# Patient Record
Sex: Female | Born: 1980 | Race: Black or African American | Hispanic: No | Marital: Single | Smoking: Current every day smoker
Health system: Southern US, Community
[De-identification: ages and names within clinical notes are randomized; demographics above are authoritative.]

## PROBLEM LIST (undated history)

## (undated) DIAGNOSIS — I10 Essential (primary) hypertension: Secondary | ICD-10-CM

## (undated) DIAGNOSIS — E669 Obesity, unspecified: Secondary | ICD-10-CM

## (undated) DIAGNOSIS — B20 Human immunodeficiency virus [HIV] disease: Secondary | ICD-10-CM

## (undated) DIAGNOSIS — Z789 Other specified health status: Secondary | ICD-10-CM

## (undated) DIAGNOSIS — E66811 Obesity, class 1: Secondary | ICD-10-CM

## (undated) HISTORY — DX: Obesity, unspecified: E66.9

## (undated) HISTORY — DX: Obesity, class 1: E66.811

## (undated) HISTORY — DX: Other specified health status: Z78.9

## (undated) HISTORY — DX: Essential (primary) hypertension: I10

## (undated) NOTE — ED Provider Notes (Signed)
 Formatting of this note is different from the original. Images from the original note were not included. [facility] [address] [place] [address] [place] [address] [phone]  History Chief Complaint  Patient presents with   Fall    Patient had a slip and fall last night on ice, patient report left knee pain. Patient report right hand pain unrelated to the fall    Patient is a 19 year old female with past medical history of anxiety and depression as well as hypertension who presented to the ED with complaint of acute onset persistent left knee pain after she slipped on ice and fell down landing on her left knee two days ago.  Patient states that the pain has been progressively worsening on especially with movement of the last 24 hours.  Patient states that she is unable to bear weight in the left leg because of the left knee pain.  Patient denies dizziness, syncope, seizures, loss of consciousness, head or neck injuries, back pain, hip pain, numbness and tingling or weakness of lower extremities bilaterally.  Past Medical History:  Diagnosis Date   Anxiety    Depression    Hypertension    HISTORY PROVIDED BY:  Patient  Past Surgical History:  Procedure Laterality Date   CESAREAN SECTION       Review of Systems  Constitutional: Negative.  Negative for appetite change, chills, fatigue and fever.  HENT: Negative.  Negative for congestion, drooling, ear discharge, ear pain, facial swelling, mouth sores, postnasal drip, rhinorrhea, tinnitus, trouble swallowing and voice change.   Eyes: Negative.  Negative for photophobia, pain, discharge, itching and visual disturbance.  Respiratory: Negative.  Negative for cough, chest tightness, shortness of breath and wheezing.   Cardiovascular: Negative.  Negative for chest pain, palpitations and leg swelling.  Gastrointestinal: Negative.  Negative for abdominal pain, blood in stool, constipation, diarrhea, nausea and vomiting.  Genitourinary:  Negative.  Negative for difficulty urinating, dysuria, enuresis, flank pain, hematuria, menstrual problem, pelvic pain and urgency.  Musculoskeletal:  Positive for arthralgias and joint swelling. Negative for back pain, gait problem, myalgias, neck pain and neck stiffness.       Left knee pain with a mild swelling  Skin: Negative.  Negative for color change, pallor, rash and wound.  Neurological: Negative.  Negative for dizziness, tremors, seizures, syncope, facial asymmetry, speech difficulty, weakness, light-headedness and numbness.  All other systems reviewed and are negative.  Physical Exam BP (!) 173/93 (BP Location: Right arm, Patient Position: Sitting)   Pulse 75   Temp 97.8 F (36.6 C) (Oral)   Resp 18   Ht 5' 4 (1.626 m)   Wt 104 kg (230 lb)   SpO2 99%   BMI 39.48 kg/m   Physical Exam Vitals and nursing note reviewed.  Constitutional:      General: She is awake.     Appearance: Normal appearance. She is well-developed, well-groomed and normal weight.  HENT:     Head: Normocephalic and atraumatic.     Jaw: There is normal jaw occlusion.     Right Ear: Hearing, tympanic membrane, ear canal and external ear normal.     Left Ear: Hearing, tympanic membrane, ear canal and external ear normal.     Nose: Nose normal.     Mouth/Throat:     Lips: Pink.     Mouth: Mucous membranes are moist.     Pharynx: Oropharynx is clear. Uvula midline.  Eyes:     General: Lids are normal. Vision grossly intact. Gaze  aligned appropriately.     Extraocular Movements: Extraocular movements intact.     Conjunctiva/sclera: Conjunctivae normal.     Pupils: Pupils are equal, round, and reactive to light.  Neck:     Trachea: Trachea and phonation normal.  Cardiovascular:     Rate and Rhythm: Normal rate and regular rhythm.     Pulses: Normal pulses.     Heart sounds: Normal heart sounds.  Pulmonary:     Effort: Pulmonary effort is normal.     Breath sounds: Normal breath sounds and air  entry. No decreased breath sounds, wheezing, rhonchi or rales.  Abdominal:     General: Abdomen is flat. Bowel sounds are normal.     Palpations: Abdomen is soft.     Tenderness: There is no abdominal tenderness.  Musculoskeletal:        General: Swelling, tenderness and signs of injury present.     Cervical back: Normal, full passive range of motion without pain, normal range of motion and neck supple.     Thoracic back: Normal.     Lumbar back: Normal.     Right knee: Normal.     Left knee: Swelling present. No deformity. Decreased range of motion. Tenderness present over the medial joint line and lateral joint line.       Legs:     Comments: Palpable left knee tenderness with mild swelling  Skin:    General: Skin is warm and dry.     Capillary Refill: Capillary refill takes less than 2 seconds.    Neurological:     General: No focal deficit present.     Mental Status: She is alert and oriented to person, place, and time.     Cranial Nerves: Cranial nerves 2-12 are intact.     Sensory: Sensation is intact.     Motor: Motor function is intact.     Coordination: Coordination is intact.     Gait: Gait is intact.     Deep Tendon Reflexes: Reflexes are normal and symmetric.  Psychiatric:        Behavior: Behavior is cooperative.   Results Labs Reviewed - No data to display  Imaging Imaging Results       X-ray Knee 3 View Left (Final result)  Result time 12/13/23 14:50:32    Final result by Adine GORMAN Fass, MD (12/13/23 14:50:32)        Impression:   No acute osseous abnormality of the left knee.  Electronically signed by:  Adine Fass MD  12/13/2023 02:50 PM EST RP Workstation: [id number]       Narrative:   EXAMINATION: XR KNEE 3 VIEW LEFT  CLINICAL INDICATION: Female, 63 years old. injury;   COMPARISON: None.  FINDINGS:  Osseous Structures: There is normal anatomic alignment. There are no acute fractures. Inferior calcaneal enthesophyte is  present.  Joint Spaces: Joint spaces are preserved.  Bone Mineralization: Normal bone mineralization.  Soft Tissues: No soft tissue abnormality.              ED Course   Procedures  Medical Decision Making  This is a 45 year old female with past medical history of anxiety and depression as well as hypertension who presented to the ED with complaint of acute onset persistent left knee pain after she slipped on ice and fell down landing on her left knee two days ago.  Patient states that the pain has been progressively worsening on especially with movement of the last 24 hours.  Patient states that  she is unable to bear weight in the left leg because of the left knee pain.  In the ED, patient is alert and oriented x3 and is not in any distress.  Patient is hypotensive but afebrile in triaged.  Left knee x-ray showed no acute fractures or subluxations.  Patient was treated for pain in the ED with ibuprofen  800 mg p.o. x1, also received Zofran  4 mg ODT sublingual tablet x1, and Norco 5 mg-325 mg p.o. x1.  Differential diagnoses include but not limited to  -left knee sprain -left knee contusion -left knee fracture -left knee muscle strain  Patient's left knee was splinted with a Ace wrap and patient fitted with crutches for ambulation.  Patient's pain is well controlled with medication.  Patient was discharged home on medications for pain, ibuprofen  800 mg every 8 hours as needed, also given a prescription of muscle relaxants Robaxin 750 mg every 8 hours as needed.  Patient is advised to follow up with the her primary care physician in 7-10 days for re-evaluation or return to the ED immediately if symptoms get worse.  Problems Addressed: Contusion of left knee, initial encounter: complicated acute illness or injury Sprain of left knee, unspecified ligament, initial encounter: complicated acute illness or injury  Amount and/or Complexity of Data Reviewed Radiology:  Decision-making  details documented in ED Course.    Details: Left knee x-ray showed no acute fractures or subluxations.  Risk Prescription drug management.  ED Disposition Discharge  Final diagnoses:  Sprain of left knee, unspecified ligament, initial encounter  Contusion of left knee, initial encounter    Follow-up Information     [place] Traci Jenkins, MD In 1 week.   Specialty: [facility] Medicine Contact information [address] [place] [address] [phone]           Alm MALVA Burns, [place] 12/13/23 2055   Alm MALVA Burns, [place] 12/13/23 2056  Electronically signed by Shonna DELENA Bien Sr., MD at 01/01/2024 11:22 AM PST

---

## 1998-10-18 ENCOUNTER — Emergency Department (HOSPITAL_COMMUNITY): Admission: EM | Admit: 1998-10-18 | Discharge: 1998-10-18 | Payer: Self-pay | Admitting: *Deleted

## 1998-10-19 ENCOUNTER — Emergency Department (HOSPITAL_COMMUNITY): Admission: EM | Admit: 1998-10-19 | Discharge: 1998-10-19 | Payer: Self-pay | Admitting: Emergency Medicine

## 2011-06-17 ENCOUNTER — Inpatient Hospital Stay (INDEPENDENT_AMBULATORY_CARE_PROVIDER_SITE_OTHER)
Admission: RE | Admit: 2011-06-17 | Discharge: 2011-06-17 | Disposition: A | Discharge status: Home / Self Care | Payer: Medicaid Other | Source: Ambulatory Visit | Attending: Family Medicine | Admitting: Family Medicine

## 2011-06-17 DIAGNOSIS — J029 Acute pharyngitis, unspecified: Secondary | ICD-10-CM

## 2013-01-29 DIAGNOSIS — B20 Human immunodeficiency virus [HIV] disease: Secondary | ICD-10-CM

## 2013-01-29 HISTORY — DX: Human immunodeficiency virus (HIV) disease: B20

## 2013-02-22 ENCOUNTER — Telehealth: Payer: Self-pay

## 2013-02-22 NOTE — Telephone Encounter (Signed)
Referral from Dr Rayford Halsted @ [facility].  Pt is moving to [place], [place] and will need to be in care.  She is newly diagnosed and not on ART.  Genotype is pending and they will forward this lab once completed.   Patient states she has [place] Medicaid.  I have informed her of the need to see our financial counselor since this will not be valid in [place] until she has it switched.    Laurell Josephs, RN

## 2013-03-15 ENCOUNTER — Ambulatory Visit: Payer: Self-pay

## 2013-03-16 ENCOUNTER — Encounter (HOSPITAL_COMMUNITY): Payer: Self-pay | Admitting: Family Medicine

## 2013-03-16 ENCOUNTER — Emergency Department (HOSPITAL_COMMUNITY)
Admission: EM | Admit: 2013-03-16 | Discharge: 2013-03-16 | Disposition: A | Discharge status: Home / Self Care | Payer: Self-pay | Attending: Emergency Medicine | Admitting: Emergency Medicine

## 2013-03-16 DIAGNOSIS — B20 Human immunodeficiency virus [HIV] disease: Secondary | ICD-10-CM

## 2013-03-16 DIAGNOSIS — F17200 Nicotine dependence, unspecified, uncomplicated: Secondary | ICD-10-CM | POA: Insufficient documentation

## 2013-03-16 DIAGNOSIS — R197 Diarrhea, unspecified: Secondary | ICD-10-CM | POA: Insufficient documentation

## 2013-03-16 DIAGNOSIS — Z3202 Encounter for pregnancy test, result negative: Secondary | ICD-10-CM | POA: Insufficient documentation

## 2013-03-16 DIAGNOSIS — Z21 Asymptomatic human immunodeficiency virus [HIV] infection status: Secondary | ICD-10-CM | POA: Insufficient documentation

## 2013-03-16 HISTORY — DX: Human immunodeficiency virus (HIV) disease: B20

## 2013-03-16 LAB — URINALYSIS, ROUTINE W REFLEX MICROSCOPIC
Bilirubin Urine: NEGATIVE
Leukocytes, UA: NEGATIVE
Nitrite: NEGATIVE
Specific Gravity, Urine: 1.012 (ref 1.005–1.030)
Urobilinogen, UA: 0.2 mg/dL (ref 0.0–1.0)
pH: 5.5 (ref 5.0–8.0)

## 2013-03-16 LAB — COMPREHENSIVE METABOLIC PANEL
BUN: 10 mg/dL (ref 6–23)
CO2: 22 mEq/L (ref 19–32)
Calcium: 9.6 mg/dL (ref 8.4–10.5)
Creatinine, Ser: 0.75 mg/dL (ref 0.50–1.10)
GFR calc Af Amer: >90 mL/min (ref >90)
GFR calc non Af Amer: >90 mL/min (ref >90)
Glucose, Bld: 87 mg/dL (ref 70–99)
Sodium: 135 mEq/L (ref 135–145)
Total Protein: 8.4 g/dL — ABNORMAL HIGH (ref 6.0–8.3)

## 2013-03-16 LAB — CBC WITH DIFFERENTIAL/PLATELET
Eosinophils Absolute: 0 10*3/uL (ref 0.0–0.7)
Eosinophils Relative: 0 % (ref 0–5)
HCT: 34.8 % — ABNORMAL LOW (ref 36.0–46.0)
Lymphocytes Relative: 51 % — ABNORMAL HIGH (ref 12–46)
Lymphs Abs: 2.1 10*3/uL (ref 0.7–4.0)
MCH: 28.6 pg (ref 26.0–34.0)
MCV: 80.2 fL (ref 78.0–100.0)
Monocytes Absolute: 0.6 10*3/uL (ref 0.1–1.0)
Monocytes Relative: 15 % — ABNORMAL HIGH (ref 3–12)
Platelets: 165 10*3/uL (ref 150–400)
RBC: 4.34 MIL/uL (ref 3.87–5.11)

## 2013-03-16 MED ORDER — METRONIDAZOLE 500 MG PO TABS: 500.0000 mg | ORAL_TABLET | Freq: Two times a day (BID) | ORAL | Status: DC

## 2013-03-16 MED ORDER — ONDANSETRON HCL 4 MG/2ML IJ SOLN
4.0000 mg | Freq: Once | INTRAMUSCULAR | Status: AC
  Administered 2013-03-16: 4 mg via INTRAVENOUS
  Filled 2013-03-16: qty 2

## 2013-03-16 MED ORDER — SODIUM CHLORIDE 0.9 % IV SOLN
1000.0000 mL | Freq: Once | INTRAVENOUS | Status: AC
  Administered 2013-03-16: 1000 mL via INTRAVENOUS

## 2013-03-16 MED ORDER — SODIUM CHLORIDE 0.9 % IV SOLN
1000.0000 mL | INTRAVENOUS | Status: DC
  Administered 2013-03-16: 1000 mL via INTRAVENOUS

## 2013-03-16 MED ORDER — METOCLOPRAMIDE HCL 10 MG PO TABS: 10.0000 mg | ORAL_TABLET | Freq: Four times a day (QID) | ORAL | Status: DC | PRN

## 2013-03-16 NOTE — ED Provider Notes (Signed)
History     CSN: [id number]  Arrival date & time 03/16/13  1147   First MD Initiated Contact with Patient 03/16/13 1156      Chief Complaint  Patient presents with  . Diarrhea    (Consider location/radiation/quality/duration/timing/severity/associated sxs/prior treatment) Patient is a 32 y.o. female presenting with diarrhea. The history is provided by the patient.  Diarrhea She has been having watery diarrhea for over a month. She had been hospitalized at [facility] last month and diagnosed with HIV. At that time, there was a note that stated she had been having diarrhea for at least a month prior to being hospitalized. She had received a course of Bactrim for a MRSA abscess but states that she was already having diarrhea when she was placed on the Bactrim. She denies fever, chills, sweats. She denies no weight loss. There's been nausea but no vomiting. She does have some intermittent abdominal cramping which can be as severe as 10/10 but usually is only about 4/10. She has tried taking Imodium AD with no change in diarrhea. She has moved here from [place] and was on [place] but has not switched her Medicaid to [facility]  Past Medical History  Diagnosis Date  . HIV (human immunodeficiency virus infection)     Past Surgical History  Procedure Laterality Date  . Cesarean section      History reviewed. No pertinent family history.  History  Substance Use Topics  . Smoking status: Current Every Day Smoker  . Smokeless tobacco: Not on file  . Alcohol Use: Yes     Comment: occ    OB History   Grav Para Term Preterm Abortions TAB SAB Ect Mult Living                  Review of Systems  Gastrointestinal: Positive for diarrhea.  All other systems reviewed and are negative.    Allergies  Sulfa antibiotics  Home Medications  No current outpatient prescriptions on file.  BP 161/80  Pulse 72  Temp(Src) 98.5 F (36.9 C)  Resp 18  SpO2 100%   LMP 02/23/2013  Physical Exam  Nursing note and vitals reviewed.  32 year old female, resting comfortably and in no acute distress. Vital signs are significant for hypertension with blood pressure 161/80. Oxygen saturation is 100%, which is normal. Head is normocephalic and atraumatic. PERRLA, EOMI. Oropharynx is clear. Neck is nontender and supple without adenopathy or JVD. Back is nontender and there is no CVA tenderness. Lungs are clear without rales, wheezes, or rhonchi. Chest is nontender. Heart has regular rate and rhythm without murmur. Abdomen is soft, flat, nontender without masses or hepatosplenomegaly and peristalsis is normoactive. Extremities have no cyanosis or edema, full range of motion is present. Skin is warm and dry without rash. Neurologic: Mental status is normal, cranial nerves are intact, there are no motor or sensory deficits.  ED Course  Procedures (including critical care time)  Results for orders placed during the hospital encounter of 03/16/13  CBC WITH DIFFERENTIAL      Result Value Range   WBC 4.1  4.0 - 10.5 K/uL   RBC 4.34  3.87 - 5.11 MIL/uL   Hemoglobin 12.4  12.0 - 15.0 g/dL   HCT 40.9 (*) 81.1 - 91.4 %   MCV 80.2  78.0 - 100.0 fL   MCH 28.6  26.0 - 34.0 pg   MCHC 35.6  30.0 - 36.0 g/dL   RDW 78.2  95.6 -  15.5 %   Platelets 165  150 - 400 K/uL   Neutrophils Relative 34 (*) 43 - 77 %   Neutro Abs 1.4 (*) 1.7 - 7.7 K/uL   Lymphocytes Relative 51 (*) 12 - 46 %   Lymphs Abs 2.1  0.7 - 4.0 K/uL   Monocytes Relative 15 (*) 3 - 12 %   Monocytes Absolute 0.6  0.1 - 1.0 K/uL   Eosinophils Relative 0  0 - 5 %   Eosinophils Absolute 0.0  0.0 - 0.7 K/uL   Basophils Relative 0  0 - 1 %   Basophils Absolute 0.0  0.0 - 0.1 K/uL  COMPREHENSIVE METABOLIC PANEL      Result Value Range   Sodium 135  135 - 145 mEq/L   Potassium 3.5  3.5 - 5.1 mEq/L   Chloride 102  96 - 112 mEq/L   CO2 22  19 - 32 mEq/L   Glucose, Bld 87  70 - 99 mg/dL   BUN 10  6 - 23  mg/dL   Creatinine, Ser 4.09  0.50 - 1.10 mg/dL   Calcium 9.6  8.4 - 81.1 mg/dL   Total Protein 8.4 (*) 6.0 - 8.3 g/dL   Albumin 4.3  3.5 - 5.2 g/dL   AST 19  0 - 37 U/L   ALT 10  0 - 35 U/L   Alkaline Phosphatase 70  39 - 117 U/L   Total Bilirubin 0.5  0.3 - 1.2 mg/dL   GFR calc non Af Amer >90  >90 mL/min   GFR calc Af Amer >90  >90 mL/min  POCT PREGNANCY, URINE      Result Value Range   Preg Test, Ur NEGATIVE  NEGATIVE    1. Diarrhea   2. HIV infection       MDM  Chronic diarrhea which is probably related to her HIV infection. If she is able to produce a stool sample, and will be sent for testing for Clostridium difficile and ova and parasites as well as a culture. If she is not able to produce a stool sample, she will be given an empirical trial of metronidazole for possible Clostridium difficile infection. She will need referral to gastroenterology. Of note, she states that when she had been hospitalized at [facility], stool cultures were negative.  Workup in the ED is unremarkable including normal orthostatic vital signs. She is referred to equal gastroenterology and to the infectious disease clinic and given a prescription for metronidazole for an empiric trial. She did not give a stool sample while in the ED.  Dione Booze, MD 03/16/13 1355

## 2013-03-16 NOTE — ED Notes (Signed)
Pt unable to give urine for specimen at this time.

## 2013-03-16 NOTE — ED Notes (Signed)
Per pt sts 3 weeks of diarrhea. sts has been taking imodium without relief. sts nausea. sts recently newly dx with HIV.

## 2013-03-31 ENCOUNTER — Ambulatory Visit (INDEPENDENT_AMBULATORY_CARE_PROVIDER_SITE_OTHER): Payer: Self-pay

## 2013-03-31 ENCOUNTER — Ambulatory Visit: Payer: Self-pay

## 2013-03-31 ENCOUNTER — Other Ambulatory Visit: Payer: Self-pay | Admitting: Infectious Disease

## 2013-03-31 DIAGNOSIS — B20 Human immunodeficiency virus [HIV] disease: Secondary | ICD-10-CM

## 2013-03-31 DIAGNOSIS — Z113 Encounter for screening for infections with a predominantly sexual mode of transmission: Secondary | ICD-10-CM

## 2013-03-31 DIAGNOSIS — Z79899 Other long term (current) drug therapy: Secondary | ICD-10-CM

## 2013-03-31 LAB — COMPLETE METABOLIC PANEL WITHOUT GFR
ALT: 11 U/L (ref 0–35)
AST: 18 U/L (ref 0–37)
Albumin: 4.7 g/dL (ref 3.5–5.2)
Alkaline Phosphatase: 80 U/L (ref 39–117)
BUN: 12 mg/dL (ref 6–23)
CO2: 27 meq/L (ref 19–32)
Calcium: 9.9 mg/dL (ref 8.4–10.5)
Chloride: 103 meq/L (ref 96–112)
Creat: 0.78 mg/dL (ref 0.50–1.10)
GFR, Est African American: >89 mL/min
GFR, Est Non African American: >89 mL/min
Glucose, Bld: 92 mg/dL (ref 70–99)
Potassium: 3.8 meq/L (ref 3.5–5.3)
Sodium: 137 meq/L (ref 135–145)
Total Bilirubin: 0.6 mg/dL (ref 0.3–1.2)
Total Protein: 8.1 g/dL (ref 6.0–8.3)

## 2013-03-31 LAB — CBC WITH DIFFERENTIAL/PLATELET
Basophils Absolute: 0 K/uL (ref 0.0–0.1)
Basophils Relative: 0 % (ref 0–1)
Eosinophils Absolute: 0 K/uL (ref 0.0–0.7)
Eosinophils Relative: 0 % (ref 0–5)
HCT: 36.8 % (ref 36.0–46.0)
Hemoglobin: 12.4 g/dL (ref 12.0–15.0)
Lymphocytes Relative: 46 % (ref 12–46)
Lymphs Abs: 2.5 K/uL (ref 0.7–4.0)
MCH: 27.7 pg (ref 26.0–34.0)
MCHC: 33.7 g/dL (ref 30.0–36.0)
MCV: 82.3 fL (ref 78.0–100.0)
Monocytes Absolute: 0.6 K/uL (ref 0.1–1.0)
Monocytes Relative: 10 % (ref 3–12)
Neutro Abs: 2.3 K/uL (ref 1.7–7.7)
Neutrophils Relative %: 44 % (ref 43–77)
Platelets: 210 K/uL (ref 150–400)
RBC: 4.47 MIL/uL (ref 3.87–5.11)
RDW: 15.3 % (ref 11.5–15.5)
WBC: 5.5 K/uL (ref 4.0–10.5)

## 2013-03-31 LAB — LIPID PANEL
HDL: 39 mg/dL — ABNORMAL LOW (ref >39)
LDL Cholesterol: 160 mg/dL — ABNORMAL HIGH (ref 0–99)

## 2013-03-31 MED ORDER — PNEUMOCOCCAL 13-VAL CONJ VACC IM SUSP: 0.5000 mL | INTRAMUSCULAR | Status: AC

## 2013-04-01 ENCOUNTER — Telehealth: Payer: Self-pay

## 2013-04-01 LAB — URINALYSIS
Hgb urine dipstick: NEGATIVE
Ketones, ur: NEGATIVE mg/dL
Nitrite: NEGATIVE
Protein, ur: NEGATIVE mg/dL
Urobilinogen, UA: 1 mg/dL (ref 0.0–1.0)

## 2013-04-01 LAB — HEPATITIS C ANTIBODY: HCV Ab: NEGATIVE

## 2013-04-01 LAB — HEPATITIS A ANTIBODY, TOTAL: Hep A Total Ab: NEGATIVE

## 2013-04-01 LAB — HEPATITIS B SURFACE ANTIGEN: Hepatitis B Surface Ag: NEGATIVE

## 2013-04-01 LAB — HEPATITIS B SURFACE ANTIBODY,QUALITATIVE: Hep B S Ab: REACTIVE — AB

## 2013-04-01 NOTE — Progress Notes (Signed)
Pt was diagnosed at [facility] but never started care for HIV.  Pt moved to [place] end of March , 2014.

## 2013-04-01 NOTE — Telephone Encounter (Signed)
Not received RW application from [facility] -

## 2013-04-02 LAB — TB SKIN TEST
Induration: 0 mm
TB Skin Test: NEGATIVE

## 2013-04-03 LAB — HIV-1 RNA ULTRAQUANT REFLEX TO GENTYP+
HIV 1 RNA Quant: 9671 copies/mL — ABNORMAL HIGH (ref <20)
HIV-1 RNA Quant, Log: 3.99 {Log} — ABNORMAL HIGH (ref <1.30)

## 2013-04-07 LAB — HIV-1 GENOTYPR PLUS

## 2013-04-14 ENCOUNTER — Ambulatory Visit (INDEPENDENT_AMBULATORY_CARE_PROVIDER_SITE_OTHER): Payer: Self-pay | Admitting: Internal Medicine

## 2013-04-14 ENCOUNTER — Encounter: Payer: Self-pay | Admitting: Internal Medicine

## 2013-04-14 VITALS — BP 167/95 | HR 66 | Temp 97.6°F | Ht 64.0 in | Wt 208.0 lb

## 2013-04-14 DIAGNOSIS — Z21 Asymptomatic human immunodeficiency virus [HIV] infection status: Secondary | ICD-10-CM

## 2013-04-14 DIAGNOSIS — B20 Human immunodeficiency virus [HIV] disease: Secondary | ICD-10-CM

## 2013-04-14 NOTE — Progress Notes (Signed)
[facility] CLINIC NOTE  RFV: establishing care with newly diagnosed HIV March 2014  Subjective:    Patient ID: Sarah Watkins, female    DOB: [birth year], 32 y.o.   MRN: [id number]  HPI Cassondra is a previously healthy 31yo AAF,with newly diagnosed HIV, CD 4 count of 530/VL 9,671, dx in the setting of acute HIV syndrome.  She started to have flu like illness in late March, with high fevers, sore throat, malaise, diarrhea, rash when she had started taking bactrim. She was first seen by urgent care screened for flu, found to have pharyngitis. Her work up revealed that she was HIV +, CD 4 count of 56, and VL 12,200. Given her clinical symptoms this was thought to be acute HIV syndrome.She was discharged on inhaled pentamidine for PCP proph for 1 month had 10 day of amoxicillin 500mg  BID for pharyngitis. Rash was thought to be due to bactrim.  Previous to this hospitalization, the month prior she was being treated for MRSA skin infection of left 3rd digit s/p I x D given bactrim at that time.   She last  Tested in oct 2011 tested negative for HIV.   RF: with female with HIV (her LT partner also has newly acquired HIV from being in relationships outside of their main one) Current Outpatient Prescriptions on File Prior to Visit  Medication Sig Dispense Refill  . ibuprofen (ADVIL,MOTRIN) 200 MG tablet Take 200 mg by mouth every 6 (six) hours as needed for pain.      Marland Kitchen loperamide (IMODIUM A-D) 2 MG tablet Take 2 mg by mouth daily as needed for diarrhea or loose stools.      . metoCLOPramide (REGLAN) 10 MG tablet Take 1 tablet (10 mg total) by mouth every 6 (six) hours as needed (nausea).  30 tablet  0  . metroNIDAZOLE (FLAGYL) 500 MG tablet Take 1 tablet (500 mg total) by mouth 2 (two) times daily.  14 tablet  0  . terbinafine (LAMISIL) 1 % cream Apply 1 application topically 3 (three) times daily.       No current facility-administered medications on file prior to visit.   Active Ambulatory Problems     Diagnosis Date Noted  . No Active Ambulatory Problems   Resolved Ambulatory Problems    Diagnosis Date Noted  . No Resolved Ambulatory Problems   Past Medical History  Diagnosis Date  . HIV (human immunodeficiency virus infection)      Social hx: long term relationship, who she thinks she acquired HIV from since he stepped out on their relationship. Has a son together, who is 34 yo. Now relocated back to [place] to be with mom. She works full time as a Lawyer. etoh and [employer] use. She has disclosed to her mother who is here with her during this first visit. Her boyfriend has relocated to [facility] as well. Will be a new patient to clinic as well  Family hx: reviewed non-contributory  Review of Systems  Constitutional: Negative for fever, chills, diaphoresis, activity change, appetite change, fatigue and unexpected weight change.  Lymph: + lymph nodes HENT: Negative for congestion, sore throat, rhinorrhea, sneezing, trouble swallowing and sinus pressure.  Eyes: Negative for photophobia and visual disturbance.  Respiratory: Negative for cough, chest tightness, shortness of breath, wheezing and stridor.  Cardiovascular: Negative for chest pain, palpitations and leg swelling.  Gastrointestinal: Negative for nausea, vomiting, abdominal pain, diarrhea, constipation, blood in stool, abdominal distention and anal bleeding.  Genitourinary: Negative for dysuria, hematuria, flank  pain and difficulty urinating.  Musculoskeletal: Negative for myalgias, back pain, joint swelling, arthralgias and gait problem.  Skin: Negative for color change, pallor, rash and wound.  Neurological: Negative for dizziness, tremors, weakness and light-headedness.  Hematological: Negative for adenopathy. Does not bruise/bleed easily.  Psychiatric/Behavioral: anxious about having hiv diagnosis      Objective:   Physical Exam BP 167/95  Pulse 66  Temp(Src) 97.6 F (36.4 C) (Oral)  Ht 5\' 4"  (1.626 m)  Wt 208 lb (94.348 kg)   BMI 35.69 kg/m2  LMP 04/14/2013 Physical Exam  Constitutional:  oriented to person, place, and time.  appears well-developed and well-nourished. No distress.  HENT: LAD + occipital on the left, and submandibular and anterior cervical chain bilaterally Mouth/Throat: Oropharynx is clear and moist. No oropharyngeal exudate.  Cardiovascular: Normal rate, regular rhythm and normal heart sounds. Exam reveals no gallop and no friction rub.  No murmur heard.  Pulmonary/Chest: Effort normal and breath sounds normal. No respiratory distress.  no wheezes.  Abdominal: Soft. Bowel sounds are normal. He exhibits no distension.  no tenderness.  Lymphadenopathy: no cervical adenopathy.  Neurological:  alert and oriented to person, place, and time.  Skin: Skin is warm and dry. No rash noted. No erythema.  Psychiatric:  a normal mood and affect.  behavior is normal.      Assessment & Plan:  HIV =  Discussed HIV timeline, treatment options, clinical trial-ARIA option, what to expect with treatment, the importance of adherence. Met with Selena Batten is interested in screening for the clinical trial. ADAP pending  oi proph = does not need any more pentamidine  hiv prevention = use condoms  Spent 45 min with patient in counseling about new HIV diagnosis

## 2013-04-19 ENCOUNTER — Telehealth: Payer: Self-pay | Admitting: *Deleted

## 2013-04-19 NOTE — Telephone Encounter (Signed)
Per Dr Drue Second called patient to advise that she needs to come for an appt to start meds since she is not going to do the study they talked about. Got the patient voicemail and left a message for her to call the office and schedule an appt asap. Will wait for the patient to return my call and schedule her then because not sure of her schedule as she just started a new job.

## 2013-04-20 ENCOUNTER — Emergency Department (HOSPITAL_COMMUNITY)
Admission: EM | Admit: 2013-04-20 | Discharge: 2013-04-20 | Disposition: A | Discharge status: Home / Self Care | Payer: Self-pay | Attending: Emergency Medicine | Admitting: Emergency Medicine

## 2013-04-20 ENCOUNTER — Encounter (HOSPITAL_COMMUNITY): Payer: Self-pay | Admitting: Radiology

## 2013-04-20 ENCOUNTER — Ambulatory Visit (INDEPENDENT_AMBULATORY_CARE_PROVIDER_SITE_OTHER): Payer: Self-pay | Admitting: Internal Medicine

## 2013-04-20 ENCOUNTER — Encounter: Payer: Self-pay | Admitting: Infectious Disease

## 2013-04-20 ENCOUNTER — Encounter: Payer: Self-pay | Admitting: Internal Medicine

## 2013-04-20 ENCOUNTER — Telehealth: Payer: Self-pay | Admitting: *Deleted

## 2013-04-20 VITALS — BP 123/81 | HR 96 | Temp 98.0°F | Ht 64.0 in | Wt 211.0 lb

## 2013-04-20 DIAGNOSIS — F17200 Nicotine dependence, unspecified, uncomplicated: Secondary | ICD-10-CM | POA: Insufficient documentation

## 2013-04-20 DIAGNOSIS — R197 Diarrhea, unspecified: Secondary | ICD-10-CM | POA: Insufficient documentation

## 2013-04-20 DIAGNOSIS — Z21 Asymptomatic human immunodeficiency virus [HIV] infection status: Secondary | ICD-10-CM | POA: Insufficient documentation

## 2013-04-20 DIAGNOSIS — B20 Human immunodeficiency virus [HIV] disease: Secondary | ICD-10-CM

## 2013-04-20 DIAGNOSIS — J029 Acute pharyngitis, unspecified: Secondary | ICD-10-CM | POA: Insufficient documentation

## 2013-04-20 DIAGNOSIS — R51 Headache: Secondary | ICD-10-CM | POA: Insufficient documentation

## 2013-04-20 MED ORDER — ELVITEG-COBIC-EMTRICIT-TENOFDF 150-150-200-300 MG PO TABS: 1.0000 | ORAL_TABLET | Freq: Every day | ORAL | Status: DC

## 2013-04-20 NOTE — ED Provider Notes (Signed)
Medical screening examination/treatment/procedure(s) were performed by non-physician practitioner and as supervising physician I was immediately available for consultation/collaboration.  Giann Obara R. Cailie Bosshart, MD 04/20/13 1558 

## 2013-04-20 NOTE — ED Notes (Signed)
Pt presents with "not feeling well" Pt states that she has had a sore throat X 2 days and headache X 1 day

## 2013-04-20 NOTE — ED Provider Notes (Signed)
History     CSN: [id number]  Arrival date & time 04/20/13  4098   First MD Initiated Contact with Patient 04/20/13 0801      Chief Complaint  Patient presents with  . Sore Throat  . Headache    (Consider location/radiation/quality/duration/timing/severity/associated sxs/prior treatment) HPI Comments: Patient who has recently been diagnosed with HIV presents today with a chief complaint of sore throat and headache.  She reports that yesterday her throat began to feel scratchy.  Today she developed a dull frontal headache.  She denies any stiffness in her neck.  Denies difficulty swallowing.  Denies fever or chills.  She has not taken anything for symptoms prior to arrival.  She is followed by Dr. Drue Second in ID.  Her most recent CD4 count on 03/31/13 was 530.    Patient is a 32 y.o. female presenting with pharyngitis and headaches. The history is provided by the patient.  Sore Throat The problem has been gradually worsening. Associated symptoms include headaches and a sore throat. Pertinent negatives include no abdominal pain, chest pain, chills, coughing, fever, nausea, neck pain, numbness, rash, urinary symptoms, visual change, vomiting or weakness. The symptoms are aggravated by swallowing. She has tried nothing for the symptoms.  Headache Pain location:  Frontal Radiates to:  Does not radiate Onset quality:  Gradual Timing:  Constant Progression:  Worsening Relieved by:  None tried Associated symptoms: diarrhea and sore throat   Associated symptoms: no abdominal pain, no blurred vision, no cough, no ear pain, no pain, no fever, no focal weakness, no loss of balance, no nausea, no near-syncope, no neck pain, no numbness, no photophobia, no sinus pressure, no syncope, no tingling, no visual change, no vomiting and no weakness     Past Medical History  Diagnosis Date  . HIV (human immunodeficiency virus infection)     Past Surgical History  Procedure Laterality Date  . Cesarean  section      History reviewed. No pertinent family history.  History  Substance Use Topics  . Smoking status: Current Every Day Smoker    Types: Cigarettes  . Smokeless tobacco: Never Used  . Alcohol Use: Yes     Comment: occ    OB History   Grav Para Term Preterm Abortions TAB SAB Ect Mult Living                  Review of Systems  Constitutional: Negative for fever and chills.  HENT: Positive for sore throat. Negative for ear pain, trouble swallowing, neck pain, voice change and sinus pressure.   Eyes: Negative for blurred vision, photophobia and pain.  Respiratory: Negative for cough and shortness of breath.   Cardiovascular: Negative for chest pain, syncope and near-syncope.  Gastrointestinal: Positive for diarrhea. Negative for nausea, vomiting, abdominal pain and blood in stool.  Skin: Negative for rash.  Neurological: Positive for headaches. Negative for focal weakness, syncope, weakness, numbness and loss of balance.  All other systems reviewed and are negative.    Allergies  Sulfa antibiotics  Home Medications   Current Outpatient Rx  Name  Route  Sig  Dispense  Refill  . ibuprofen (ADVIL,MOTRIN) 200 MG tablet   Oral   Take 400 mg by mouth every 6 (six) hours as needed for pain or headache.            BP 130/75  Pulse 72  Temp(Src) 98.7 F (37.1 C) (Oral)  Resp 14  SpO2 99%  LMP 04/14/2013  Physical Exam  Nursing note and vitals reviewed. Constitutional: She appears well-developed and well-nourished. No distress.  HENT:  Head: Normocephalic and atraumatic. No trismus in the jaw.  Mouth/Throat: Uvula is midline, oropharynx is clear and moist and mucous membranes are normal. No oropharyngeal exudate, posterior oropharyngeal edema, posterior oropharyngeal erythema or tonsillar abscesses.  Eyes: EOM are normal. Pupils are equal, round, and reactive to light.  Neck: Normal range of motion. Neck supple.  Cardiovascular: Normal rate, regular rhythm  and normal heart sounds.   Pulmonary/Chest: Effort normal and breath sounds normal. No respiratory distress. She has no wheezes. She has no rales.  Abdominal: Soft. Bowel sounds are normal. There is no tenderness.  Musculoskeletal: Normal range of motion.  Lymphadenopathy:    She has no cervical adenopathy.  Neurological: She is alert. She has normal strength. No cranial nerve deficit or sensory deficit. Gait normal.  Skin: Skin is warm and dry. She is not diaphoretic.  Psychiatric: She has a normal mood and affect.    ED Course  Procedures (including critical care time)  Labs Reviewed - No data to display No results found.   No diagnosis found.    MDM  Patient with a history of HIV presents today with sore throat and headache.  Patient afebrile.  Non toxic appearing.  Normal neurological exam.  No signs of Strep Throat.  CD4 count on 03/31/13 was 530.  Patient followed by Dr. Drue Second in ID.  Feel that patient is stable for discharge.  Return precautions given.        Pascal Lux Ambrose, PA-C 04/20/13 [phone]

## 2013-04-20 NOTE — Telephone Encounter (Signed)
RN reviewed record.  Pt was to have made a f/u visit with Dr Drue Second.  Seen in ED this AM.  Still not feeling "up to par."  Requesting appt.  Appt scheduled for this AM @ 1115 w/ Dr. Drue Second.

## 2013-04-20 NOTE — Progress Notes (Signed)
[facility] NOTE  RFV: sick visit/sore throat Subjective:    Patient ID: Sarah Watkins, female    DOB: Apr 13, 1981, 32 y.o.   MRN: [id number]  HPI Sarah Watkins is a 32yo F with newly diagnosed HIV, CD 4 count of 530/VL 9,67, ARt naive ,awaiting ADAP approval.  Went to ED this morning concerned about feeling ill with new onset diarrhea,sore throat, headache. She decided not to do clinical trial. She reports having 3 loose stools this am. And 1 last night. Associated with abdominal cramping and some flatulence. No fever/chills but possibly night sweats.  Monday last day she worked. 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 She is worried that she may lose her job if she takes days off for being ill.   No one sick at home. No change in diet.  Current Outpatient Prescriptions on File Prior to Visit  Medication Sig Dispense Refill  . ibuprofen (ADVIL,MOTRIN) 200 MG tablet Take 400 mg by mouth every 6 (six) hours as needed for pain or headache.        No current facility-administered medications on file prior to visit.   Active Ambulatory Problems    Diagnosis Date Noted  . No Active Ambulatory Problems   Resolved Ambulatory Problems    Diagnosis Date Noted  . No Resolved Ambulatory Problems   Past Medical History  Diagnosis Date  . HIV (human immunodeficiency virus infection)   social and family hx unchanged since last week   Review of Systems Only + GI symptoms per hpi, mild headache and sore throat    Objective:   Physical Exam BP 123/81  Pulse 96  Temp(Src) 98 F (36.7 C) (Oral)  Ht 5\' 4"  (1.626 m)  Wt 211 lb (95.709 kg)  BMI 36.2 kg/m2  LMP 04/14/2013 Physical Exam  Constitutional:  oriented to person, place, and time. appears well-developed and well-nourished. No distress.  HENT:  Mouth/Throat: Oropharynx is clear and moist. No oropharyngeal exudate.  Cardiovascular:  Normal rate, regular rhythm and normal heart sounds. Exam reveals no gallop and no friction rub.  No murmur heard.  Pulmonary/Chest: Effort normal and breath sounds normal. No respiratory distress. He has no wheezes.  Abdominal: Soft. Bowel sounds are normal. He exhibits no distension. There is no tenderness.  Lymphadenopathy: no cervical adenopathy.  Neurological: alert and oriented to person, place, and time.  Skin: Skin is warm and dry. No rash noted. No erythema.  Psychiatric:  Worried appearing.          Assessment & Plan:   hiv =  Will start adap process, plan on starting stribild. Likely to start med in 6 wk.discussed adherence  Diarrhea = sounds like it  could be norovirus. She does work in Teacher, music. Symptoms < 24hr. No temp/n/v yet. She also cared for a cdiff pt. will give stool kit for c.difficile pcr to bring back at her leisure. Continue with supportive care. Will give her an employer note for leave of abscense. Will call back treatment if c.diff is positive  Headache= likely stress related  rtc in 6 wk

## 2013-04-21 ENCOUNTER — Other Ambulatory Visit: Payer: Self-pay

## 2013-04-21 DIAGNOSIS — R197 Diarrhea, unspecified: Secondary | ICD-10-CM

## 2013-05-02 ENCOUNTER — Encounter: Payer: Self-pay | Admitting: Internal Medicine

## 2013-05-03 ENCOUNTER — Encounter: Payer: Self-pay | Admitting: Internal Medicine

## 2013-05-04 ENCOUNTER — Other Ambulatory Visit: Payer: Self-pay | Admitting: *Deleted

## 2013-05-04 DIAGNOSIS — B20 Human immunodeficiency virus [HIV] disease: Secondary | ICD-10-CM

## 2013-05-04 MED ORDER — ELVITEG-COBIC-EMTRICIT-TENOFDF 150-150-200-300 MG PO TABS: 1.0000 | ORAL_TABLET | Freq: Every day | ORAL | Status: DC

## 2013-05-12 ENCOUNTER — Other Ambulatory Visit: Payer: Self-pay | Admitting: *Deleted

## 2013-05-12 DIAGNOSIS — R110 Nausea: Secondary | ICD-10-CM

## 2013-05-12 MED ORDER — ONDANSETRON HCL 4 MG PO TABS: 4.0000 mg | ORAL_TABLET | Freq: Four times a day (QID) | ORAL | Status: DC | PRN

## 2013-05-12 NOTE — Telephone Encounter (Signed)
Patient called and advised that she has been having nausea after taking her Stribild. She advised it usually happens within the first 1-2 hours after taking the medication. She has only been taking the med for 4 days. Advised her this is normal but that we can send something in to help her for a couple of days because it should subside soon. Spoke with Dr Drue Second and got a verbal for Zofran sent it to the pharmacy and told the patient.

## 2013-06-06 NOTE — Telephone Encounter (Signed)
Called patient to try and schedule an appt she was not in but I spoke with her mother and she advised she will have call the ofice asap.

## 2013-07-05 ENCOUNTER — Telehealth: Payer: Self-pay | Admitting: *Deleted

## 2013-07-05 NOTE — Telephone Encounter (Signed)
Patient called and advised that she is having a toothache with lots of pain in her top jaw near her ear. She advised she is taking large doses of ibuprofen and wonders if she could have an antibiotic until she sees the dentist at the end of the month. She made an appt with Juliette Alcide today for the dental clinic. Advised patient will have to ask the doctor about an antibiotic and give her a call back. She gave [phone] as the best call back number

## 2013-07-06 NOTE — Telephone Encounter (Signed)
Can call in amoxicillin 500mg  TID x 10 d. See if can get her to dentistry. Make sure she does not take more than 800mg  ibuprofen TID. She can alternate with tylenol

## 2013-07-11 ENCOUNTER — Other Ambulatory Visit: Payer: Self-pay | Admitting: Licensed Clinical Social Worker

## 2013-07-11 DIAGNOSIS — K047 Periapical abscess without sinus: Secondary | ICD-10-CM

## 2013-07-11 MED ORDER — AMOXICILLIN 500 MG PO TABS: 500.0000 mg | ORAL_TABLET | Freq: Three times a day (TID) | ORAL | Status: DC

## 2013-07-12 NOTE — Telephone Encounter (Signed)
Patient informed. 

## 2013-07-19 ENCOUNTER — Ambulatory Visit: Payer: Self-pay

## 2013-07-25 ENCOUNTER — Other Ambulatory Visit (INDEPENDENT_AMBULATORY_CARE_PROVIDER_SITE_OTHER): Payer: Self-pay

## 2013-07-25 ENCOUNTER — Other Ambulatory Visit: Payer: Self-pay | Admitting: Licensed Clinical Social Worker

## 2013-07-25 DIAGNOSIS — R109 Unspecified abdominal pain: Secondary | ICD-10-CM

## 2013-07-25 DIAGNOSIS — R52 Pain, unspecified: Secondary | ICD-10-CM

## 2013-07-25 DIAGNOSIS — B373 Candidiasis of vulva and vagina: Secondary | ICD-10-CM

## 2013-07-25 MED ORDER — FLUCONAZOLE 100 MG PO TABS: 100.0000 mg | ORAL_TABLET | Freq: Every day | ORAL | Status: DC

## 2013-07-26 LAB — URINALYSIS, ROUTINE W REFLEX MICROSCOPIC
Glucose, UA: NEGATIVE mg/dL
Hgb urine dipstick: NEGATIVE
Nitrite: NEGATIVE
Protein, ur: NEGATIVE mg/dL
Urobilinogen, UA: 0.2 mg/dL (ref 0.0–1.0)

## 2013-07-26 LAB — URINALYSIS, MICROSCOPIC ONLY

## 2013-08-03 ENCOUNTER — Ambulatory Visit: Payer: Self-pay

## 2013-08-30 ENCOUNTER — Inpatient Hospital Stay (HOSPITAL_COMMUNITY)
Admission: AD | Admit: 2013-08-30 | Discharge: 2013-09-01 | DRG: 882 | Disposition: A | Discharge status: Home / Self Care | Payer: No Typology Code available for payment source | Source: Intra-hospital | Attending: Psychiatry | Admitting: Psychiatry

## 2013-08-30 ENCOUNTER — Encounter (HOSPITAL_COMMUNITY): Payer: Self-pay | Admitting: *Deleted

## 2013-08-30 ENCOUNTER — Emergency Department (HOSPITAL_COMMUNITY)
Admission: EM | Admit: 2013-08-30 | Discharge: 2013-08-30 | Disposition: A | Discharge status: Other Acute Inpatient Hospital | Payer: Self-pay | Attending: Emergency Medicine | Admitting: Emergency Medicine

## 2013-08-30 ENCOUNTER — Encounter (HOSPITAL_COMMUNITY): Payer: Self-pay

## 2013-08-30 DIAGNOSIS — F329 Major depressive disorder, single episode, unspecified: Secondary | ICD-10-CM

## 2013-08-30 DIAGNOSIS — Z21 Asymptomatic human immunodeficiency virus [HIV] infection status: Secondary | ICD-10-CM | POA: Insufficient documentation

## 2013-08-30 DIAGNOSIS — R45851 Suicidal ideations: Secondary | ICD-10-CM

## 2013-08-30 DIAGNOSIS — T50992A Poisoning by other drugs, medicaments and biological substances, intentional self-harm, initial encounter: Secondary | ICD-10-CM | POA: Insufficient documentation

## 2013-08-30 DIAGNOSIS — B20 Human immunodeficiency virus [HIV] disease: Secondary | ICD-10-CM | POA: Diagnosis present

## 2013-08-30 DIAGNOSIS — F17200 Nicotine dependence, unspecified, uncomplicated: Secondary | ICD-10-CM | POA: Insufficient documentation

## 2013-08-30 DIAGNOSIS — F4323 Adjustment disorder with mixed anxiety and depressed mood: Principal | ICD-10-CM | POA: Diagnosis present

## 2013-08-30 DIAGNOSIS — T50991A Poisoning by other drugs, medicaments and biological substances, accidental (unintentional), initial encounter: Secondary | ICD-10-CM | POA: Insufficient documentation

## 2013-08-30 DIAGNOSIS — Z79899 Other long term (current) drug therapy: Secondary | ICD-10-CM

## 2013-08-30 DIAGNOSIS — F1210 Cannabis abuse, uncomplicated: Secondary | ICD-10-CM | POA: Insufficient documentation

## 2013-08-30 LAB — URINE MICROSCOPIC-ADD ON

## 2013-08-30 LAB — ETHANOL: Alcohol, Ethyl (B): <11 mg/dL (ref 0–11)

## 2013-08-30 LAB — COMPREHENSIVE METABOLIC PANEL
AST: 16 U/L (ref 0–37)
Albumin: 4.1 g/dL (ref 3.5–5.2)
BUN: 6 mg/dL (ref 6–23)
CO2: 24 mEq/L (ref 19–32)
Calcium: 10.2 mg/dL (ref 8.4–10.5)
Chloride: 102 mEq/L (ref 96–112)
Creatinine, Ser: 0.72 mg/dL (ref 0.50–1.10)
GFR calc non Af Amer: >90 mL/min (ref >90)
Total Bilirubin: 0.2 mg/dL — ABNORMAL LOW (ref 0.3–1.2)

## 2013-08-30 LAB — URINALYSIS, ROUTINE W REFLEX MICROSCOPIC
Bilirubin Urine: NEGATIVE
Hgb urine dipstick: NEGATIVE
Nitrite: NEGATIVE
Protein, ur: NEGATIVE mg/dL
Urobilinogen, UA: 0.2 mg/dL (ref 0.0–1.0)
pH: 7.5 (ref 5.0–8.0)

## 2013-08-30 LAB — RAPID URINE DRUG SCREEN, HOSP PERFORMED
Amphetamines: NOT DETECTED
Barbiturates: NOT DETECTED
Cocaine: NOT DETECTED
Opiates: NOT DETECTED
Tetrahydrocannabinol: POSITIVE — AB

## 2013-08-30 LAB — CBC WITH DIFFERENTIAL/PLATELET
Basophils Relative: 0 % (ref 0–1)
Eosinophils Relative: 1 % (ref 0–5)
Lymphocytes Relative: 36 % (ref 12–46)
Lymphs Abs: 2.5 10*3/uL (ref 0.7–4.0)
Neutro Abs: 3.8 10*3/uL (ref 1.7–7.7)
Neutrophils Relative %: 54 % (ref 43–77)
Platelets: 274 10*3/uL (ref 150–400)
RBC: 4.23 MIL/uL (ref 3.87–5.11)
RDW: 13.9 % (ref 11.5–15.5)
WBC: 6.9 10*3/uL (ref 4.0–10.5)

## 2013-08-30 LAB — PREGNANCY, URINE: Preg Test, Ur: NEGATIVE

## 2013-08-30 LAB — ACETAMINOPHEN LEVEL: Acetaminophen (Tylenol), Serum: <15 ug/mL (ref 10–30)

## 2013-08-30 MED ORDER — ALUM & MAG HYDROXIDE-SIMETH 200-200-20 MG/5ML PO SUSP: 30.0000 mL | ORAL | Status: DC | PRN

## 2013-08-30 MED ORDER — SODIUM CHLORIDE 0.9 % IV SOLN
INTRAVENOUS | Status: DC
  Administered 2013-08-30: 12:00:00 via INTRAVENOUS

## 2013-08-30 MED ORDER — IBUPROFEN 400 MG PO TABS: 600.0000 mg | ORAL_TABLET | Freq: Three times a day (TID) | ORAL | Status: DC | PRN

## 2013-08-30 MED ORDER — ACETAMINOPHEN 500 MG PO TABS
1000.0000 mg | ORAL_TABLET | Freq: Once | ORAL | Status: AC
  Administered 2013-08-30: 1000 mg via ORAL
  Filled 2013-08-30: qty 2

## 2013-08-30 MED ORDER — LORAZEPAM 1 MG PO TABS: 1.0000 mg | ORAL_TABLET | Freq: Three times a day (TID) | ORAL | Status: DC | PRN

## 2013-08-30 MED ORDER — ACETAMINOPHEN 325 MG PO TABS
650.0000 mg | ORAL_TABLET | Freq: Four times a day (QID) | ORAL | Status: DC | PRN
  Administered 2013-08-31 – 2013-09-01 (×2): 650 mg via ORAL
  Filled 2013-08-30: qty 2

## 2013-08-30 MED ORDER — ZOLPIDEM TARTRATE 5 MG PO TABS: 10.0000 mg | ORAL_TABLET | Freq: Every evening | ORAL | Status: DC | PRN

## 2013-08-30 MED ORDER — NICOTINE 21 MG/24HR TD PT24: 21.0000 mg | MEDICATED_PATCH | Freq: Every day | TRANSDERMAL | Status: DC

## 2013-08-30 MED ORDER — ELVITEG-COBIC-EMTRICIT-TENOFDF 150-150-200-300 MG PO TABS
1.0000 | ORAL_TABLET | Freq: Every day | ORAL | Status: DC
  Filled 2013-08-30: qty 1

## 2013-08-30 MED ORDER — ELVITEG-COBIC-EMTRICIT-TENOFDF 150-150-200-300 MG PO TABS
1.0000 | ORAL_TABLET | Freq: Every day | ORAL | Status: DC
  Administered 2013-08-31 – 2013-09-01 (×2): 1 via ORAL
  Filled 2013-08-30 (×4): qty 1

## 2013-08-30 MED ORDER — PROMETHAZINE HCL 25 MG PO TABS
25.0000 mg | ORAL_TABLET | Freq: Four times a day (QID) | ORAL | Status: DC | PRN
  Filled 2013-08-30: qty 1

## 2013-08-30 MED ORDER — ACETAMINOPHEN 325 MG PO TABS: 650.0000 mg | ORAL_TABLET | ORAL | Status: DC | PRN

## 2013-08-30 MED ORDER — MAGNESIUM HYDROXIDE 400 MG/5ML PO SUSP: 30.0000 mL | Freq: Every day | ORAL | Status: DC | PRN

## 2013-08-30 MED ORDER — ZOLPIDEM TARTRATE 10 MG PO TABS
10.0000 mg | ORAL_TABLET | Freq: Every evening | ORAL | Status: DC | PRN
  Administered 2013-08-30 – 2013-08-31 (×2): 10 mg via ORAL
  Filled 2013-08-30 (×2): qty 1

## 2013-08-30 NOTE — Progress Notes (Signed)
Adult Psychoeducational Group Note  Date:  08/30/2013 Time:  11:51 PM  Group Topic/Focus:  Wrap-Up Group:   The focus of this group is to help patients review their daily goal of treatment and discuss progress on daily workbooks.  Participation Level:  Active  Participation Quality:  Appropriate and Sharing  Affect:  Appropriate  Cognitive:  Appropriate  Insight: Appropriate  Engagement in Group:  Engaged  Modes of Intervention:  Discussion, Education, Socialization and Support  Additional Comments:  Pt identified the reason for her being in the hospital was a drug overdose. Pt smiled and laughed throughout the group. Pt stated that she is a good mother and that she always smiles when asked to identify two positive character traits.   Laural Benes, Ajla Mcgeachy 08/30/2013, 11:51 PM

## 2013-08-30 NOTE — ED Provider Notes (Signed)
Patient accepted to behavioral health and I filled out transfer form.  Patient seen this shift by Dr. Lynelle Doctor and she is still in department and has cleared patient.  I have spoke with her and she agrees with transfer.   Hilario Quarry, MD 08/30/13 [phone]

## 2013-08-30 NOTE — ED Notes (Signed)
Pt doing tele psych right now.

## 2013-08-30 NOTE — ED Notes (Signed)
Family arrived at pts room by coming in the back door using ID badge.  Informed they had to go to the front desk to check in before coming back here and not to use badge.  Charge RN Martie Lee made aware.

## 2013-08-30 NOTE — ED Notes (Addendum)
Family arrived at pts room wearing [place] ID badges.  Pt reported to RN she did not want people to know she was here.  Family escorted to waiting room while tele psych was in process and pt reminded of password to provide family.

## 2013-08-30 NOTE — ED Provider Notes (Signed)
CSN: [id number]     Arrival date & time 08/30/13  1027 History   First MD Initiated Contact with Patient 08/30/13 1109     Chief Complaint  Patient presents with  . Ingestion   Went into her room at 11:13 but was not in her room yet  (Consider location/radiation/quality/duration/timing/severity/associated sxs/prior Treatment) HPI Patient reports she was recently diagnosed with HIV, she moved here from [place] about 6 months ago to be close to family. She reports she was infected by her significant other who did not know that they had HIV. She states "I'm overly tired of everything" when asked what that means she states her bills, rent, and her job as a Lawyer. She and her partner are both having problems adjusting to the HIV diagnosis.  She states she's having difficulty taking the new medications. She states about an hour prior to arrival to the ED she took an overdose of her Zofran pills. She states "it's better if I'm not here". She states when she took the pills she hoped she would fall asleep and go away. She states her boyfriend walked in as she was taking the pills and took the pill bottle away from her and called her sister who brought her to the emergency department. She states right now she feels a little sleepy and has a headache that she relates to crying andshe states her head feels "heavy". She states she still does not feel like anything is better since she took the overdose and her feelings haven't changed. In other words she doesn't regret what she did.   IUD placed 5 years ago in November   PCP none ID Dr Drue Second  Past Medical History  Diagnosis Date  . HIV (human immunodeficiency virus infection)    Past Surgical History  Procedure Laterality Date  . Cesarean section     History reviewed. No pertinent family history. History  Substance Use Topics  . Smoking status: Current Every Day Smoker -- 0.40 packs/day    Types: Cigarettes  . Smokeless tobacco: Never Used  .  Alcohol Use: Yes     Comment: occ   Employed as a CNA Lives with significant other   OB History   Grav Para Term Preterm Abortions TAB SAB Ect Mult Living                 Review of Systems  All other systems reviewed and are negative.    Allergies  Sulfa antibiotics  Home Medications   Current Outpatient Rx  Name  Route  Sig  Dispense  Refill  . elvitegravir-cobicistat-emtricitabine-tenofovir (STRIBILD) 150-150-200-300 MG TABS tablet   Oral   Take 1 tablet by mouth daily with breakfast.         . ondansetron (ZOFRAN) 4 MG tablet   Oral   Take 4 mg by mouth every 6 (six) hours as needed for nausea. Only if needed for severe nausea.          BP 155/96  Pulse 76  Temp(Src) 98.9 F (37.2 C) (Oral)  Resp 16  SpO2 97%  LMP 07/31/2013  Vital signs normal   Physical Exam  Nursing note and vitals reviewed. Constitutional: She is oriented to person, place, and time. She appears well-developed and well-nourished.  Non-toxic appearance. She does not appear ill. No distress.  HENT:  Head: Normocephalic and atraumatic.  Right Ear: External ear normal.  Left Ear: External ear normal.  Nose: Nose normal. No mucosal edema or rhinorrhea.  Mouth/Throat: Oropharynx  is clear and moist and mucous membranes are normal. No dental abscesses or edematous.  Eyes: Conjunctivae and EOM are normal. Pupils are equal, round, and reactive to light.  Neck: Normal range of motion and full passive range of motion without pain. Neck supple.  Cardiovascular: Normal rate, regular rhythm and normal heart sounds.  Exam reveals no gallop and no friction rub.   No murmur heard. Pulmonary/Chest: Effort normal and breath sounds normal. No respiratory distress. She has no wheezes. She has no rhonchi. She has no rales. She exhibits no tenderness and no crepitus.  Abdominal: Soft. Normal appearance and bowel sounds are normal. She exhibits no distension. There is no tenderness. There is no rebound and  no guarding.  Musculoskeletal: Normal range of motion. She exhibits no edema and no tenderness.  Moves all extremities well.   Neurological: She is alert and oriented to person, place, and time. She has normal strength. No cranial nerve deficit.  Skin: Skin is warm, dry and intact. No rash noted. No erythema. No pallor.  Psychiatric: Her speech is normal and behavior is normal. Her mood appears not anxious.  Gets tearful, flat affect    ED Course  Procedures (including critical care time)   Nursing staff called Poison Control, states to observe for EKG changes and observe for 4-6 hrs. Pt placed on cardiac monitor.   Pt has headache, given ice pack. Continues to c/o headache, given acetaminophen  Pt on cardiac monitor without arrythmia.    Repeat EKG at 6 hrs post ingestion is unchanged, is being transferred to the Psych [facility] to be assessed for admission. Pt related to me she still felt nothing was better or her intentions had changed after her OD.   Pt sent to Pod C to await psychiatric evaluation.   Labs Review  Results for orders placed during the hospital encounter of 08/30/13  CBC WITH DIFFERENTIAL      Result Value Range   WBC 6.9  4.0 - 10.5 K/uL   RBC 4.23  3.87 - 5.11 MIL/uL   Hemoglobin 12.3  12.0 - 15.0 g/dL   HCT 16.1 (*) 09.6 - 04.5 %   MCV 82.5  78.0 - 100.0 fL   MCH 29.1  26.0 - 34.0 pg   MCHC 35.2  30.0 - 36.0 g/dL   RDW 40.9  81.1 - 91.4 %   Platelets 274  150 - 400 K/uL   Neutrophils Relative % 54  43 - 77 %   Neutro Abs 3.8  1.7 - 7.7 K/uL   Lymphocytes Relative 36  12 - 46 %   Lymphs Abs 2.5  0.7 - 4.0 K/uL   Monocytes Relative 8  3 - 12 %   Monocytes Absolute 0.6  0.1 - 1.0 K/uL   Eosinophils Relative 1  0 - 5 %   Eosinophils Absolute 0.1  0.0 - 0.7 K/uL   Basophils Relative 0  0 - 1 %   Basophils Absolute 0.0  0.0 - 0.1 K/uL  COMPREHENSIVE METABOLIC PANEL      Result Value Range   Sodium 137  135 - 145 mEq/L   Potassium 3.6  3.5 - 5.1  mEq/L   Chloride 102  96 - 112 mEq/L   CO2 24  19 - 32 mEq/L   Glucose, Bld 95  70 - 99 mg/dL   BUN 6  6 - 23 mg/dL   Creatinine, Ser 7.82  0.50 - 1.10 mg/dL   Calcium 95.6  8.4 -  10.5 mg/dL   Total Protein 8.4 (*) 6.0 - 8.3 g/dL   Albumin 4.1  3.5 - 5.2 g/dL   AST 16  0 - 37 U/L   ALT 12  0 - 35 U/L   Alkaline Phosphatase 82  39 - 117 U/L   Total Bilirubin 0.2 (*) 0.3 - 1.2 mg/dL   GFR calc non Af Amer >90  >90 mL/min   GFR calc Af Amer >90  >90 mL/min  ETHANOL      Result Value Range   Alcohol, Ethyl (B) <11  0 - 11 mg/dL  ACETAMINOPHEN LEVEL      Result Value Range   Acetaminophen (Tylenol), Serum <15.0  10 - 30 ug/mL  SALICYLATE LEVEL      Result Value Range   Salicylate Lvl <2.0 (*) 2.8 - 20.0 mg/dL  URINALYSIS, ROUTINE W REFLEX MICROSCOPIC      Result Value Range   Color, Urine YELLOW  YELLOW   APPearance CLEAR  CLEAR   Specific Gravity, Urine 1.012  1.005 - 1.030   pH 7.5  5.0 - 8.0   Glucose, UA NEGATIVE  NEGATIVE mg/dL   Hgb urine dipstick NEGATIVE  NEGATIVE   Bilirubin Urine NEGATIVE  NEGATIVE   Ketones, ur NEGATIVE  NEGATIVE mg/dL   Protein, ur NEGATIVE  NEGATIVE mg/dL   Urobilinogen, UA 0.2  0.0 - 1.0 mg/dL   Nitrite NEGATIVE  NEGATIVE   Leukocytes, UA TRACE (*) NEGATIVE  URINE RAPID DRUG SCREEN (HOSP PERFORMED)      Result Value Range   Opiates NONE DETECTED  NONE DETECTED   Cocaine NONE DETECTED  NONE DETECTED   Benzodiazepines NONE DETECTED  NONE DETECTED   Amphetamines NONE DETECTED  NONE DETECTED   Tetrahydrocannabinol POSITIVE (*) NONE DETECTED   Barbiturates NONE DETECTED  NONE DETECTED  PREGNANCY, URINE      Result Value Range   Preg Test, Ur NEGATIVE  NEGATIVE  URINE MICROSCOPIC-ADD ON      Result Value Range   Squamous Epithelial / LPF RARE  RARE   WBC, UA 0-2  <3 WBC/hpf   Bacteria, UA RARE  RARE   Laboratory interpretation all normal    Date: 08/30/2013  Rate: 72  Rhythm: normal sinus rhythm  QRS Axis: normal  Intervals: normal   ST/T Wave abnormalities: NSTWI septal leads  Conduction Disutrbances:none  Narrative Interpretation:   Old EKG Reviewed: none available  #2  Date: 08/30/2013  Rate: 83  Rhythm: normal sinus rhythm  QRS Axis: normal  Intervals: normal  ST/T Wave abnormalities: nonspecific T wave changes in septal leads  Conduction Disutrbances:none  Narrative Interpretation:   Old EKG Reviewed: unchanged from earlier today    MDM   1. Overdose drug, initial encounter   2. Depression   3. Suicidal ideation     Disposition pending for inpatient psychiatric admission   Devoria Albe, MD, Franz Dell, MD 08/30/13 [phone]

## 2013-08-30 NOTE — BH Assessment (Signed)
Tele Assessment Note   Sarah Watkins is an 32 y.o. female brought to [facility] by her mother after an intentional over dose on Zofran earlier today.  She reports that she's been feeling overwhelmed by life stress.  In March, she was diagnosed with HIV contracted from her significant other of many years-he was also recently diagnosed.  Her mother convinced her to move from [place] to [place] to have more family support and they have had some difficulty adjusting.  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  In addition to the relocation, they have moved once since coming to [facility] and that added additional expenses to their budget.  [name] also reports that her boyfriend was out of work for a period of time and that they've had some car trouble.  Today, she was feeling "backed against he wall" wondering "could anything else happen"  She reports that she took 10 Zofran.  She told the EDP that she was attempting to end her life and that she remorseful about not waking up, but told this Clinical research associate that she was "not really trying to end my life-maybe take some ease off me or make me extra relaxed"  She does endorse depression includine anhedonia, crying spells, isolating behavior, feelings of worthlessness and guilt, difficulty sleeping, and decreased concentration.  She denies HI and AVH.  Consulted with Dr Lolly Mustache who accepted the patient to the service of Dr Dub Mikes in room 304-1, due to suicide attempt, continued depression, and lack of connection to a psychiatrist or therapist beyond the bit of counseling she's had with [facility].  Pt is agreeable to inpatient treatment.  ED staff notified of disposition.  Pt will come by security.  Axis I: Depressive Disorder NOS Axis II: Deferred Axis III:  Past Medical History  Diagnosis Date  . HIV (human immunodeficiency virus infection)    Axis IV: economic problems, problems with access to health care services and problems with primary support group Axis V:  41-50 serious symptoms  Past Medical History:  Past Medical History  Diagnosis Date  . HIV (human immunodeficiency virus infection)     Past Surgical History  Procedure Laterality Date  . Cesarean section      Family History: History reviewed. No pertinent family history.  Social History:  reports that she has been smoking Cigarettes.  She has been smoking about 0.40 packs per day. She has never used smokeless tobacco. She reports that  drinks alcohol. She reports that she uses illicit drugs (Marijuana) about 7 times per week.  Additional Social History:  Alcohol / Drug Use History of alcohol / drug use?: No history of alcohol / drug abuse  CIWA: CIWA-Ar BP: 148/96 mmHg Pulse Rate: 78 COWS:    Allergies:  Allergies  Allergen Reactions  . Sulfa Antibiotics Hives, Itching, Rash and Other (See Comments)    Various gland swelling.    Home Medications:  (Not in a hospital admission)  OB/GYN Status:  Patient's last menstrual period was 07/31/2013.  General Assessment Data Location of Assessment: [facility] ED Is this a Tele or Face-to-Face Assessment?: Tele Assessment Is this an Initial Assessment or a Re-assessment for this encounter?: Initial Assessment Living Arrangements: Spouse/significant other;Children (significant other and 50 year old son) Can pt return to current living arrangement?: Yes Admission Status: Voluntary Is patient capable of signing voluntary admission?: Yes Transfer from: Acute Hospital Referral Source: Self/Family/Friend  Medical Screening Exam [facility] Walk-in ONLY) Medical Exam completed: No Reason for MSE not  completed:  (Pt in ED)  [facility] Crisis Care Plan Living Arrangements: Spouse/significant other;Children (significant other and 12 year old son)  Education Status Is patient currently in school?: No 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, CNA school  Risk to self Suicidal Ideation: Yes-Currently Present Suicidal Intent: Yes-Currently  Present Is patient at risk for suicide?: Yes Suicidal Plan?: Yes-Currently Present Specify Current Suicidal Plan: overdose on zofran Access to Means: Yes Specify Access to Suicidal Means: zofran What has been your use of drugs/alcohol within the last 12 months?: denies Previous Attempts/Gestures: No How many times?: 0 Intentional Self Injurious Behavior: None Family Suicide History: No Recent stressful life event(s): Other (Comment);Turmoil (Comment);Recent negative physical changes;Financial Problems (recnt diagnosis, financial, move) Persecutory voices/beliefs?: No Depression: Yes Depression Symptoms: Despondent;Insomnia;Tearfulness;Isolating;Feeling angry/irritable;Feeling worthless/self pity;Loss of interest in usual pleasures;Guilt;Fatigue Substance abuse history and/or treatment for substance abuse?: No Suicide prevention information given to non-admitted patients: Yes  Risk to Others Homicidal Ideation: No Thoughts of Harm to Others: No Current Homicidal Intent: No Current Homicidal Plan: No Access to Homicidal Means: No Identified Victim: na History of harm to others?: No Assessment of Violence: None Noted Violent Behavior Description: n Does patient have access to weapons?: No Criminal Charges Pending?: No Does patient have a court date: No  Psychosis Hallucinations: None noted Delusions: None noted  Mental Status Report Appear/Hygiene: Other (Comment) (unremarkable) Eye Contact: Good Motor Activity: Freedom of movement Speech: Logical/coherent Level of Consciousness: Alert Mood: Depressed Affect: Inconsistent with thought content Anxiety Level: Minimal Thought Processes: Coherent;Relevant Judgement: Unimpaired Orientation: Person;Place;Time;Situation Obsessive Compulsive Thoughts/Behaviors: Minimal  Cognitive Functioning Concentration: Decreased Memory: Recent Intact;Remote Intact IQ: Average Insight: Fair Impulse Control: Fair Appetite: Good Weight  Loss: 0 Weight Gain: 0 Sleep: Decreased Total Hours of Sleep: 5 Vegetative Symptoms: None  ADLScreening [facility] Assessment Services) Patient's cognitive ability adequate to safely complete daily activities?: Yes Patient able to express need for assistance with ADLs?: Yes Independently performs ADLs?: Yes (appropriate for developmental age)  Prior Inpatient Therapy Prior Inpatient Therapy: No  Prior Outpatient Therapy Prior Outpatient Therapy: Yes Prior Therapy Dates: ongoing Prior Therapy Facilty/Provider(s): some counseling [facility] Reason for Treatment: HIV diagnosis  ADL Screening (condition at time of admission) Patient's cognitive ability adequate to safely complete daily activities?: Yes Patient able to express need for assistance with ADLs?: Yes Independently performs ADLs?: Yes (appropriate for developmental age)       Abuse/Neglect Assessment (Assessment to be complete while patient is alone) Physical Abuse: Denies Verbal Abuse: Denies Sexual Abuse: Denies Exploitation of patient/patient's resources: Denies Self-Neglect: Denies Values / Beliefs Cultural Requests During Hospitalization: None Spiritual Requests During Hospitalization: None   Advance Directives (For Healthcare) Advance Directive: Patient does not have advance directive;Patient would not like information Pre-existing out of facility DNR order (yellow form or pink MOST form): No    Additional Information 1:1 In Past 12 Months?: No CIRT Risk: No Elopement Risk: No Does patient have medical clearance?: Yes     Disposition:  Disposition Initial Assessment Completed for this Encounter: Yes Disposition of Patient: Inpatient treatment program Type of inpatient treatment program: Adult (Accepted by Dr Lolly Mustache)  Lissa Hoard Marlana Latus 08/30/2013 6:09 PM

## 2013-08-30 NOTE — ED Notes (Signed)
Spoke with poison control about patient and updated on patients v/s, lab results, hx, and plan of care. Poison controlled stated they would close out her case.

## 2013-08-30 NOTE — ED Notes (Signed)
Per pt and family, one hour ago pt took several zofran po. Pt states she "doesnt know" if it was intentional or not. No acute distress noted at triage. See previous note for info from poison control.

## 2013-08-30 NOTE — ED Notes (Signed)
Pt visitor reports that patient took too many pills. Reports too many zofran pills. More than 10 pills. Spoke with poison control and could cause possible ekg changes, and should rule out acetamiphen, watch for 4-6 hours.

## 2013-08-31 ENCOUNTER — Encounter (HOSPITAL_COMMUNITY): Payer: Self-pay | Admitting: Psychiatry

## 2013-08-31 DIAGNOSIS — F4323 Adjustment disorder with mixed anxiety and depressed mood: Principal | ICD-10-CM

## 2013-08-31 NOTE — Progress Notes (Signed)
Adult Psychoeducational Group Note  Date:  08/31/2013 Time:  6:42 PM  Group Topic/Focus:  Personal Choices and Values:   The focus of this group is to help patients assess and explore the importance of values in their lives, how their values affect their decisions, how they express their values and what opposes their expression.  Participation Level:  Minimal  Participation Quality:  Appropriate  Affect:  Appropriate  Cognitive:  Appropriate  Insight: Limited  Engagement in Group:  Limited  Modes of Intervention:  Education and Support  Additional Comments:  Pt attended group, but did not offer any verbal contributions to group discussion. Pt actively participated in group by completing worksheets associated with group topic.  Reinaldo Raddle K 08/31/2013, 6:42 PM

## 2013-08-31 NOTE — B[facility] Suicide Risk Assessment (Signed)
[facility] INPATIENT:  Family/Significant Other Suicide Prevention Education  Suicide Prevention Education:  Contact Attempts: Lyndee Hensen (boyfriend) [phone] and Ninetta Lights (pt's mother) [phone] have been identified by the patient as the family member/significant other with whom the patient will be residing, and identified as the person(s) who will aid the patient in the event of a mental health crisis.  With written consent from the patient, two attempts were made to provide suicide prevention education, prior to and/or following the patient's discharge.  We were unsuccessful in providing suicide prevention education.  A suicide education pamphlet was given to the patient to share with family/significant other.  Date and time of first attempt: 11:00AM 08/31/13 Date and time of second attempt: 08/31/13 3:00PM (unable to leave voicemail on both numbers provided by pt.)  Smart, Anntionette Madkins 08/31/2013, 3:02 PM

## 2013-08-31 NOTE — BHH Group Notes (Signed)
[facility] LCSW Aftercare Discharge Planning Group Note   08/31/2013 10:39 AM  Participation Quality: Appropriate    Mood/Affect:  Appropriate  Depression Rating:  4  Anxiety Rating:  0  Thoughts of Suicide:  No Will you contract for safety?   NA  Current AVH:  No  Plan for Discharge/Comments:  Pt reports that she had a "nervous breakdown" after allowing stressors such as "finances, my kid and husband, and car issues get the best of me." Pt reports that she plans to return home after d/c and must get letter for work to excuse her missed days. Pt hoping for d/c today. Per MD, pt can d/c Thursday if husband contacted and agreeable to d/c plan. Pt does not report any mental health providers at this time.  Transportation Means: husband   Supports: Retail buyer, Research scientist (physical sciences)

## 2013-08-31 NOTE — B[facility] Suicide Risk Assessment (Signed)
Suicide Risk Assessment  Admission Assessment     Nursing information obtained from:  Patient Demographic factors:  NA Current Mental Status:  NA Loss Factors:  Financial problems / change in socioeconomic status Historical Factors:  NA Risk Reduction Factors:  Responsible for children under 34 years of age;Sense of responsibility to family;Employed;Living with another person, especially a relative;Positive social support;Positive therapeutic relationship  CLINICAL FACTORS: none identified    COGNITIVE FEATURES THAT CONTRIBUTE TO RISK:  Polarized thinking Thought constriction (tunnel vision)    SUICIDE RISK:   Mild:  Suicidal ideation of limited frequency, intensity, duration, and specificity.  There are no identifiable plans, no associated intent, mild dysphoria and related symptoms, good self-control (both objective and subjective assessment), few other risk factors, and identifiable protective factors, including available and accessible social support.  PLAN OF CARE: Supportive approach/coping skills                               CBT/Mindfulness/stress management  I certify that inpatient services furnished can reasonably be expected to improve the patient's condition.  Dorette Hartel A 08/31/2013, 3:28 PM

## 2013-08-31 NOTE — Progress Notes (Signed)
Pt. Presented to [facility] ED accompanied by her Mother after taking 10/4mg  zofran.  Pt. Reports that she has been depressed over bills and feeling like she just could not get caught up. Pt. States that she found out in March that she is HIV+ and her boyfriend is also HIV+.  They have a 75yr. Old son together.  The boyfriend found her in the bedroom with a handful of the pills putting them in her mouth.  He knocked them out of her hand and called her Mother.  The pt.'s Mother is the only family member that knows she is HIV+, and pt. Has lots of family members that work in the [employer].  This is the reason for the XXX.   Pt only drinks ETOH on special occasions, no more than once a month.  Pt does admit she smokes cigarettes and THC (appr. 1 blunt a day).  Pt. States that it helps her relax.  Pt.  Presently denies SI and HI.  No complaints of pain or discomfort. No A or VH.  Pt. Lets her stress build up and keeps it to herself but realizes she cannot do that any more.  Pt. Was escorted to the 300 hall where she was oriented to the adult unit and offered food and drink.

## 2013-08-31 NOTE — Tx Team (Signed)
Interdisciplinary Treatment Plan Update (Adult)  Date: 08/31/2013    Time Reviewed: 10:45 AM  Progress in Treatment:  Attending groups: Yes Participating in groups:  Yes Taking medication as prescribed: Yes  Tolerating medication: Yes  Family/Significant othe contact made: Not yet. SPE required for this pt.  Patient understands diagnosis: Yes, AEB seeking treatment for SI and mood stabilization. "I think I had a little bit of a nervous breakdown because I let my stress get to me."  Discussing patient identified problems/goals with staff: Yes  Medical problems stabilized or resolved: Yes  Denies suicidal/homicidal ideation: Yes, during group/self report.  Patient has not harmed self or Others: Yes  New problem(s) identified: n/a  Discharge Plan or Barriers: CSW assessing for appropriate referrals.  Additional comments:Pt. Presented to [facility] ED accompanied by her Mother after taking 10/4mg  zofran. Pt. Reports that she has been depressed over bills and feeling like she just could not get caught up. Pt. States that she found out in March that she is HIV+ and her boyfriend is also HIV+. They have a 62yr. Old son together. The boyfriend found her in the bedroom with a handful of the pills putting them in her mouth. He knocked them out of her hand and called her Mother. The pt.'s Mother is the only family member that knows she is HIV+, and pt. Has lots of family members that work in the [employer]. This is the reason for the XXX. Pt only drinks ETOH on special occasions, no more than once a month. Pt does admit she smokes cigarettes and THC (appr. 1 blunt a day). Pt. States that it helps her relax. Pt. Presently denies SI and HI. No complaints of pain or discomfort. No A or VH. Pt. Lets her stress build up and keeps it to herself but realizes she cannot do that any more.   Reason for Continuation of Hospitalization: Mood stabilizaton Estimated length of stay: 1 day For review of initial/current  patient goals, please see plan of care.  Attendees:  Patient:    Family:    Physician: Geoffery Lyons MD 08/31/2013 10:45 AM   Nursing: Lupita Leash RN 08/31/2013 10:45 AM   Clinical Social Worker Cameo Shewell Smart, LCSWA  08/31/2013 10:45 AM   Other: Philippa Chester RN 08/31/2013 10:45 AM  Other: Chandra Batch. PA 08/31/2013 10:45 AM   Other: Darden Dates Nurse CM  08/31/2013 10:45 AM   Other:    Scribe for Treatment Team:  [employer] LCSWA 08/31/2013 10:45 AM

## 2013-08-31 NOTE — Progress Notes (Signed)
Pt attended group 

## 2013-08-31 NOTE — Progress Notes (Signed)
Patient ID: Sarah Watkins, female   DOB: [birth year], 32 y.o.   MRN: [id number] D: pt. Reports she thinks she may need a antidepressant, but doesn't want to stay to be monitored, but consents to going to IOP for follow up. "I think I may need it to help level me off" A: Writer introduced self to client and encourages her to speak with physician about her concerns. Staff will monitor q54min for safety. R: Pt. Is safe on the unit. Pt. Attended group and noted on the unit interacted and doing crafts.

## 2013-08-31 NOTE — B[facility] Group Notes (Signed)
[facility] LCSW Group Therapy  08/31/2013 2:58 PM  Type of Therapy:  Group Therapy  Participation Level:  Active  Participation Quality:  Attentive  Affect:  Appropriate  Cognitive:  Appropriate  Insight:  Engaged  Engagement in Therapy:  Engaged  Modes of Intervention:  Discussion, Education, Exploration, Socialization and Support  Summary of Progress/Problems: Emotion Regulation: This group focused on both positive and negative emotion identification and allowed group members to process ways to identify feelings, regulate negative emotions, and find healthy ways to manage internal/external emotions. Group members were asked to reflect on a time when their reaction to an emotion led to a negative outcome and explored how alternative responses using emotion regulation would have benefited them. Group members were also asked to discuss a time when emotion regulation was utilized when a negative emotion was experienced. Sarah Watkins was engaged and attentive throughout today's group. She identified "confused and nervous" as emotions that she experienced during the admission process into [facility]. Sarah Watkins was able to explain to the group how these emotions felt both physically and mentally. She shows progress in the group setting AEB her ability to actively participate in group discussion and identify triggers and healthier ways to reduce her vulnerability to negative emotions. "I really need to start opening up to my family. They try to help me and I push them away because I want to be seen as strong. But I realize now how important it is to ask for help."    Watkins, Sarah Ferraiolo 08/31/2013, 2:58 PM

## 2013-08-31 NOTE — Tx Team (Signed)
Initial Interdisciplinary Treatment Plan  PATIENT STRENGTHS: (choose at least two) Active sense of humor Average or above average intelligence Capable of independent living Communication skills Supportive family/friends  PATIENT STRESSORS: Financial difficulties   PROBLEM LIST: Problem List/Patient Goals Date to be addressed Date deferred Reason deferred Estimated date of resolution  Pt. Denies SI  States that she       Was depressed over finances      And her diagnosis of AIDS       Needs coping skills for anxiety      And depression                               DISCHAR GE CRITERIA:  Ability to meet basic life and health needs Improved stabilization in mood, thinking, and/or behavior Motivation to continue treatment in a less acute level of care Need for constant or close observation no longer present  PRELIMINARY DISCHARGE PLAN: Participate in family therapy Return to previous living arrangement Return to previous work or school arrangements  PATIENT/FAMIILY INVOLVEMENT: This treatment plan has been presented to and reviewed with the patient, ADI SEALES, and/or family member, .  The patient and family have been given the opportunity to ask questions and make suggestions.  Cooper Render 08/31/2013, 1:12 AM

## 2013-08-31 NOTE — H&P (Signed)
Psychiatric Admission Assessment Adult  Patient Identification:  Sarah Watkins Date of Evaluation:  08/31/2013 Chief Complaint:  DEPRESSIVE DISORDER NOS History of Present Illness:: "Made a crazy dumb decision." Was under a lt of stress everything fell of all of the sudden, states her job, her car, taking care of her son ( 34 Y/O) . States she holds things in, tries to do everything else herself. States she worries, takes care of other people, does not take care of herself. States she has support but does not use it.  It has been building up for the last few weeks. March of this year found out about her HIV status. Goes to [employer]. states yesterday she called out staid home started thinking, worrying, getting overwhelmed. She reached out. Elements:  Location:  in patient. Quality:  unable to appropiately handle the stress. Severity:  moderate. Timing:  every day. Duration:  building up last few weeks. Context:  increased stress unable to cope. Associated Signs/Synptoms: Depression Symptoms:  depressed mood, anhedonia, fatigue, anxiety, insomnia, loss of energy/fatigue, disturbed sleep, isolates (Hypo) Manic Symptoms:  Irritable Mood, Labiality of Mood, Anxiety Symptoms:  Excessive Worry, Psychotic Symptoms:  Denies PTSD Symptoms: Denies   Psychiatric Specialty Exam: Physical Exam  Review of Systems  Constitutional: Negative.   Eyes: Negative.   Respiratory: Negative.        7 or 8 a day  Cardiovascular: Negative.   Gastrointestinal: Positive for heartburn.  Genitourinary: Negative.   Musculoskeletal: Negative.   Skin: Negative.   Neurological: Positive for headaches.  Endo/Heme/Allergies: Negative.   Psychiatric/Behavioral: Positive for depression. The patient is nervous/anxious and has insomnia.     Blood pressure 125/85, pulse 83, temperature 98.6 F (37 C), temperature source Oral, resp. rate 16, last menstrual period 07/31/2013.There is no weight  on file to calculate BMI.  General Appearance: Fairly Groomed  Patent attorney::  Fair  Speech:  Clear and Coherent  Volume:  Normal  Mood:  Anxious  Affect:  Appropriate  Thought Process:  Coherent and Goal Directed  Orientation:  Full (Time, Place, and Person)  Thought Content:  worries, concerns  Suicidal Thoughts:  No  Homicidal Thoughts:  No  Memory:  Immediate;   Fair Recent;   Fair Remote;   Fair  Judgement:  Good  Insight:  Present  Psychomotor Activity:  Restlessness  Concentration:  Fair  Recall:  Fair  Akathisia:  No  Handed:    AIMS (if indicated):     Assets:  Desire for Improvement Housing Social Support Transportation  Sleep:  Number of Hours: 6.5    Past Psychiatric History: Diagnosis: Adjustment Disorder with mixed emotional features  Hospitalizations: Denies  Outpatient Care: [facility]  Substance Abuse Care: Denies  Self-Mutilation:  Denies  Suicidal Attempts: yes  Violent Behaviors: Denies   Past Medical History:   Past Medical History  Diagnosis Date  . HIV (human immunodeficiency virus infection)     Allergies:   Allergies  Allergen Reactions  . Sulfa Antibiotics Hives, Itching, Rash and Other (See Comments)    Various gland swelling.   PTA Medications: Prescriptions prior to admission  Medication Sig Dispense Refill  . elvitegravir-cobicistat-emtricitabine-tenofovir (STRIBILD) 150-150-200-300 MG TABS tablet Take 1 tablet by mouth daily with breakfast.      . ondansetron (ZOFRAN) 4 MG tablet Take 4 mg by mouth every 6 (six) hours as needed for nausea. Only if needed for severe nausea.        Previous Psychotropic Medications:  Medication/Dose  Denies               Substance Abuse History in the last 12 months:  no  Consequences of Substance Abuse: NA  Social History:  reports that she has been smoking Cigarettes.  She has been smoking about 0.40 packs per day. She has never used smokeless tobacco. She reports that   drinks alcohol. She reports that she uses illicit drugs (Marijuana) about 7 times per week. Additional Social History: Pain Medications: see PTA Prescriptions: see PTA Over the Counter: ibuprofen for headache occasionally History of alcohol / drug use?: Yes Longest period of sobriety (when/how long): THC daily 1 blunt 5years/ no ETOH Withdrawal Symptoms: Other (Comment) (NA)                    Current Place of Residence:  Lives with her father's son, and a 34 Y/O Place of Birth:   Family Members: Marital Status:  committed for the last 8 years Children:  Sons: 34 Y/O  Daughters: Relationships: Education:  GED Educational Problems/Performance: Religious Beliefs/Practices: History of Abuse (Emotional/Phsycial/Sexual) Denies Armed forces technical officer; 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 Legal History: Hobbies/Interests:  Family History:  History reviewed. No pertinent family history.  Results for orders placed during the hospital encounter of 08/30/13 (from the past 72 hour(s))  CBC WITH DIFFERENTIAL     Status: Abnormal   Collection Time    08/30/13 12:00 PM      Result Value Range   WBC 6.9  4.0 - 10.5 K/uL   RBC 4.23  3.87 - 5.11 MIL/uL   Hemoglobin 12.3  12.0 - 15.0 g/dL   HCT 16.1 (*) 09.6 - 04.5 %   MCV 82.5  78.0 - 100.0 fL   MCH 29.1  26.0 - 34.0 pg   MCHC 35.2  30.0 - 36.0 g/dL   RDW 40.9  81.1 - 91.4 %   Platelets 274  150 - 400 K/uL   Neutrophils Relative % 54  43 - 77 %   Neutro Abs 3.8  1.7 - 7.7 K/uL   Lymphocytes Relative 36  12 - 46 %   Lymphs Abs 2.5  0.7 - 4.0 K/uL   Monocytes Relative 8  3 - 12 %   Monocytes Absolute 0.6  0.1 - 1.0 K/uL   Eosinophils Relative 1  0 - 5 %   Eosinophils Absolute 0.1  0.0 - 0.7 K/uL   Basophils Relative 0  0 - 1 %   Basophils Absolute 0.0  0.0 - 0.1 K/uL  COMPREHENSIVE METABOLIC PANEL     Status: Abnormal   Collection Time    08/30/13 12:00 PM      Result Value Range   Sodium  137  135 - 145 mEq/L   Potassium 3.6  3.5 - 5.1 mEq/L   Chloride 102  96 - 112 mEq/L   CO2 24  19 - 32 mEq/L   Glucose, Bld 95  70 - 99 mg/dL   BUN 6  6 - 23 mg/dL   Creatinine, Ser 7.82  0.50 - 1.10 mg/dL   Calcium 95.6  8.4 - 21.3 mg/dL   Total Protein 8.4 (*) 6.0 - 8.3 g/dL   Albumin 4.1  3.5 - 5.2 g/dL   AST 16  0 - 37 U/L   ALT 12  0 - 35 U/L   Alkaline Phosphatase 82  39 - 117 U/L   Total Bilirubin 0.2 (*) 0.3 -  1.2 mg/dL   GFR calc non Af Amer >90  >90 mL/min   GFR calc Af Amer >90  >90 mL/min   Comment: (NOTE)     The eGFR has been calculated using the CKD EPI equation.     This calculation has not been validated in all clinical situations.     eGFR's persistently <90 mL/min signify possible Chronic Kidney     Disease.  ETHANOL     Status: None   Collection Time    08/30/13 12:00 PM      Result Value Range   Alcohol, Ethyl (B) <11  0 - 11 mg/dL   Comment:            LOWEST DETECTABLE LIMIT FOR     SERUM ALCOHOL IS 11 mg/dL     FOR MEDICAL PURPOSES ONLY  ACETAMINOPHEN LEVEL     Status: None   Collection Time    08/30/13 12:00 PM      Result Value Range   Acetaminophen (Tylenol), Serum <15.0  10 - 30 ug/mL   Comment:            THERAPEUTIC CONCENTRATIONS VARY     SIGNIFICANTLY. A RANGE OF 10-30     ug/mL MAY BE AN EFFECTIVE     CONCENTRATION FOR MANY PATIENTS.     HOWEVER, SOME ARE BEST TREATED     AT CONCENTRATIONS OUTSIDE THIS     RANGE.     ACETAMINOPHEN CONCENTRATIONS     >150 ug/mL AT 4 HOURS AFTER     INGESTION AND >50 ug/mL AT 12     HOURS AFTER INGESTION ARE     OFTEN ASSOCIATED WITH TOXIC     REACTIONS.  SALICYLATE LEVEL     Status: Abnormal   Collection Time    08/30/13 12:00 PM      Result Value Range   Salicylate Lvl <2.0 (*) 2.8 - 20.0 mg/dL  URINALYSIS, ROUTINE W REFLEX MICROSCOPIC     Status: Abnormal   Collection Time    08/30/13 12:00 PM      Result Value Range   Color, Urine YELLOW  YELLOW   APPearance CLEAR  CLEAR   Specific  Gravity, Urine 1.012  1.005 - 1.030   pH 7.5  5.0 - 8.0   Glucose, UA NEGATIVE  NEGATIVE mg/dL   Hgb urine dipstick NEGATIVE  NEGATIVE   Bilirubin Urine NEGATIVE  NEGATIVE   Ketones, ur NEGATIVE  NEGATIVE mg/dL   Protein, ur NEGATIVE  NEGATIVE mg/dL   Urobilinogen, UA 0.2  0.0 - 1.0 mg/dL   Nitrite NEGATIVE  NEGATIVE   Leukocytes, UA TRACE (*) NEGATIVE  URINE RAPID DRUG SCREEN (HOSP PERFORMED)     Status: Abnormal   Collection Time    08/30/13 12:00 PM      Result Value Range   Opiates NONE DETECTED  NONE DETECTED   Cocaine NONE DETECTED  NONE DETECTED   Benzodiazepines NONE DETECTED  NONE DETECTED   Amphetamines NONE DETECTED  NONE DETECTED   Tetrahydrocannabinol POSITIVE (*) NONE DETECTED   Barbiturates NONE DETECTED  NONE DETECTED   Comment:            DRUG SCREEN FOR MEDICAL PURPOSES     ONLY.  IF CONFIRMATION IS NEEDED     FOR ANY PURPOSE, NOTIFY LAB     WITHIN 5 DAYS.                LOWEST DETECTABLE LIMITS  FOR URINE DRUG SCREEN     Drug Class       Cutoff (ng/mL)     Amphetamine      1000     Barbiturate      200     Benzodiazepine   200     Tricyclics       300     Opiates          300     Cocaine          300     THC              50  PREGNANCY, URINE     Status: None   Collection Time    08/30/13 12:00 PM      Result Value Range   Preg Test, Ur NEGATIVE  NEGATIVE   Comment:            THE SENSITIVITY OF THIS     METHODOLOGY IS >20 mIU/mL.  URINE MICROSCOPIC-ADD ON     Status: None   Collection Time    08/30/13 12:00 PM      Result Value Range   Squamous Epithelial / LPF RARE  RARE   WBC, UA 0-2  <3 WBC/hpf   Bacteria, UA RARE  RARE   Psychological Evaluations:  Assessment:   DSM5:  Schizophrenia Disorders:   Obsessive-Compulsive Disorders:   Trauma-Stressor Disorders:   Substance/Addictive Disorders:   Depressive Disorders:    AXIS I:  Adjustment Disorder with Mixed Emotional Features AXIS II:  Deferred AXIS III:   Past Medical History   Diagnosis Date  . HIV (human immunodeficiency virus infection)    AXIS IV:  problems with primary support group AXIS V:  51-60 moderate symptoms  Treatment Plan/Recommendations:  Supportive approach/coping skills/relapse prevention                                                                 CBT/stress management Treatment Plan Summary: Daily contact with patient to assess and evaluate symptoms and progress in treatment Medication management Current Medications:  Current Facility-Administered Medications  Medication Dose Route Frequency Provider Last Rate Last Dose  . acetaminophen (TYLENOL) tablet 650 mg  650 mg Oral Q6H PRN Evanna Janann August, NP      . alum & mag hydroxide-simeth (MAALOX/MYLANTA) 200-200-20 MG/5ML suspension 30 mL  30 mL Oral Q4H PRN Evanna Janann August, NP      . elvitegravir-cobicistat-emtricitabine-tenofovir (STRIBILD) 150-150-200-300 MG tablet 1 tablet  1 tablet Oral Q breakfast Cleotis Nipper, MD   1 tablet at 08/31/13 0844  . magnesium hydroxide (MILK OF MAGNESIA) suspension 30 mL  30 mL Oral Daily PRN Evanna Janann August, NP      . zolpidem (AMBIEN) tablet 10 mg  10 mg Oral QHS PRN Cleotis Nipper, MD   10 mg at 08/30/13 2146    Observation Level/Precautions:  15 minute checks  Laboratory:  As per the ED  Psychotherapy:  Individual/group/CBT/stress management  Medications:  Will hold any medications right now  Consultations:    Discharge Concerns:    Estimated LOS: 2-3 days  Other:     I certify that inpatient services furnished can reasonably be expected to improve the patient's condition.   Creighton Longley A 10/1/20149:59  AM

## 2013-08-31 NOTE — Progress Notes (Signed)
D: Patient denies SI/HI and A/V hallucinations; patient reports that she slept well after the medication  A: Monitored q 15 minutes; patient encouraged to attend groups; patient educated about medications; patient given medications per physician orders; patient encouraged to express feelings and/or concerns  R: Patient is bright and animated; patient is cooperative and appropriate to circumstances; patient's interaction with staff and peers is approrpiate;  patient is taking medications as prescribed and tolerating medications; patient is attending all groups and engaging

## 2013-09-01 MED ORDER — ELVITEG-COBIC-EMTRICIT-TENOFDF 150-150-200-300 MG PO TABS: 1.0000 | ORAL_TABLET | Freq: Every day | ORAL | Status: DC

## 2013-09-01 MED ORDER — ZOLPIDEM TARTRATE 10 MG PO TABS: 10.0000 mg | ORAL_TABLET | Freq: Every evening | ORAL | Status: DC | PRN

## 2013-09-01 NOTE — Progress Notes (Signed)
Adult Psychoeducational Group Note  Date:  09/01/2013 Time:  2:30 PM  Group Topic/Focus:  Building Self Esteem:   The Focus of this group is helping patients become aware of the effects of self-esteem on their lives, the things they and others do that enhance or undermine their self-esteem, seeing the relationship between their level of self-esteem and the choices they make and learning ways to enhance self-esteem.  Participation Level:  Minimal  Participation Quality:  Attentive  Affect:  Appropriate  Cognitive:  Appropriate  Insight: Good  Engagement in Group:  Lacking and Resistant  Modes of Intervention:  Discussion, Education, Socialization and Support  Additional Comments: When asked to list things she loves, she talked about her relationship with her son who is turning 5 tomorrow.  Reynolds Bowl 09/01/2013, 2:30 PM

## 2013-09-01 NOTE — Progress Notes (Signed)
D.  Discharge instructions reviewed with pt.  And pt. Denies SI/HI.  A.  Pt. Discharged and escorted from unit by Clinical research associate. Encouragement and support given.  R.  Pt. Verbalized an understanding of discharge instructions.

## 2013-09-01 NOTE — Discharge Summary (Signed)
Physician Discharge Summary Note  Patient:  Sarah Watkins is an 32 y.o., female MRN:  [id number] DOB:  1981-01-29 Patient phone:  [phone] (home)  Patient address:   [address]  [place] [address],   Date of Admission:  08/30/2013 Date of Discharge: 09/01/13  Reason for Admission: Depressed mood  Discharge Diagnoses: Active Problems:   Adjustment disorder with mixed anxiety and depressed mood  Review of Systems  Constitutional: Negative.   HENT: Negative.   Eyes: Negative.   Respiratory: Negative.   Cardiovascular: Negative.   Gastrointestinal: Negative.   Genitourinary: Negative.   Musculoskeletal: Negative.   Skin: Negative.   Endo/Heme/Allergies: Negative.   Psychiatric/Behavioral: Positive for depression (Stabilized with medication prior to discharge). Negative for suicidal ideas, hallucinations and memory loss. The patient is nervous/anxious (Stabilized with medication prior to discharge) and has insomnia (Stabilized with medication prior to discharge).    DSM5: Schizophrenia Disorders:  NA Obsessive-Compulsive Disorders:  NA Trauma-Stressor Disorders:  Adjustment disorder with mixed anxiety and depressed mood  Substance/Addictive Disorders:  NA Depressive Disorders:  Adjustment disorder with mixed anxiety and depressed mood   Axis Diagnosis:   AXIS I:  Adjustment disorder with mixed anxiety and depressed mood AXIS II:  Deferred AXIS III:   Past Medical History  Diagnosis Date  . HIV (human immunodeficiency virus infection)    AXIS IV:  other psychosocial or environmental problems AXIS V:  63  Level of Care:  OP  Hospital Course:  "Made a crazy dumb decision." Was under a lt of stress everything fell of all of the sudden, states her job, her car, taking care of her son ( 4 Y/O) . States she holds things in, tries to do everything else herself. States she worries, takes care of other people, does not take care of herself. States she has support  but does not use it. It has been building up for the last few weeks. March of this year found out about her HIV status. Goes to [employer]. states yesterday she called out staid home started thinking, worrying, getting overwhelmed. She reached out.  While a patient in this hospital and after admission assessment/evaluation, it was determined based on patient's symptoms that her toxicology/UDS reports indicated no presence of alcohol rather (+) THC in her systems. Although feeling depressed and upset, patient was not presenting with any withdrawal symptoms of alcohol and or any other substances. As a result, she received no detoxification treatment protocol. However, it was determined that she will need medication management to re-stabilize her current depressive mood symptoms as she has a history of Adjustment disorder with mixed anxiety and depressed mood disorder. She was then ordered and received Ambien 10 mg Q bedtime for sleep. She also was enrolled in group counseling sessions and activities where she was counseled and learned coping skills that should help her cope better and manage her symptoms effectively after discharge. She also received medication management and monitoring for her other previously existing medical issues and concerns. She tolerated her treatment regimen without any significant adverse effects and or reactions presented.   Patient did respond positively to her treatment regimen. This is evidenced by her daily reports of improved mood, reduction of symptoms and presentation of good affect/eye contact. She attended treatment team meeting this am and met with her treatment team members. Her reason for admission, present symptoms, response to treatment and discharge plans discussed with patient. Ms. Gilles endorsed that her symptoms has stabilized and that she is ready  for discharge to pursue psychiatric care on outpatient basis. It was then agreed upon that patient will  follow-up care at the [facility] here in [place], [place], between the hours of 08:00 am and 09:00 am, Monday thru Friday. She has been instructed and informed that the appointment at [facility] is a walk-in appointment.The address, date, time and contact information for the [facility] clinic provided for patient in writing.  Upon discharge, Ms. Mclees adamantly denies any suicidal, homicidal ideations, auditory, visual hallucinations, paranoia and or delusional thoughts. She was provided with 4 days worth of prescription for Ambien 10 mg her d/c medications. She left [facility] with all personal belongings in no apparent distress. Transportation per boy-friend.  Consults:  psychiatry  Significant Diagnostic Studies:  labs: CBC with diff, CMP, UDS, Toxicology tests, U/A  Discharge Vitals:   Blood pressure 127/84, pulse 105, temperature 98.2 F (36.8 C), temperature source Oral, resp. rate 18, last menstrual period 07/31/2013. There is no weight on file to calculate BMI. Lab Results:   No results found for this or any previous visit (from the past 72 hour(s)).  Physical Findings: AIMS: Facial and Oral Movements Muscles of Facial Expression: None, normal Lips and Perioral Area: None, normal Jaw: None, normal Tongue: None, normal,Extremity Movements Upper (arms, wrists, hands, fingers): None, normal Lower (legs, knees, ankles, toes): None, normal, Trunk Movements Neck, shoulders, hips: None, normal, Overall Severity Severity of abnormal movements (highest score from questions above): None, normal Incapacitation due to abnormal movements: None, normal Patient's awareness of abnormal movements (rate only patient's report): No Awareness, Dental Status Current problems with teeth and/or dentures?: No Does patient usually wear dentures?: No  CIWA:    COWS:     Psychiatric Specialty Exam: See Psychiatric Specialty Exam and Suicide Risk Assessment completed by Attending Physician prior to  discharge.  Discharge destination:  Home  Is patient on multiple antipsychotic therapies at discharge:  No   Has Patient had three or more failed trials of antipsychotic monotherapy by history:  No  Recommended Plan for Multiple Antipsychotic Therapies: NA     Medication List    STOP taking these medications       ondansetron 4 MG tablet  Commonly known as:  ZOFRAN      TAKE these medications     Indication   elvitegravir-cobicistat-emtricitabine-tenofovir 150-150-200-300 MG Tabs tablet  Commonly known as:  STRIBILD  Take 1 tablet by mouth daily with breakfast. For HIV infection   Indication:  HIV Disease     zolpidem 10 MG tablet  Commonly known as:  AMBIEN  Take 1 tablet (10 mg total) by mouth at bedtime as needed for sleep.   Indication:  Trouble Sleeping       Follow-up Information   Follow up with [facility]. (Walk in between 8AM-9AM Monday through Friday for hospital followup/medication management. )    Contact information:   [address] [place] [address] Phone: [phone] Fax: [phone]     Follow-up recommendations:  Activity:  As tolerated Diet: As recommended by your primary care doctor. Keep all scheduled follow-up appointments as recommended. Continue to work on lifestyle changes that could help to better manage your stress/mood/anxiety Comments:  Take all your medications as prescribed by your mental healthcare provider. Report any adverse effects and or reactions from your medicines to your outpatient provider promptly. Patient is instructed and cautioned to not engage in alcohol and or illegal drug use while on prescription medicines. In the event of worsening symptoms, patient  is instructed to call the crisis hotline, 911 and or go to the nearest ED for appropriate evaluation and treatment of symptoms. Follow-up with your primary care provider for your other medical issues, concerns and or health care needs.   Total Discharge Time:  Greater  than 30 minutes.  Signed: Sanjuana Kava, PMHNP-BC 09/02/2013, 2:04 PM Agree with assessment and plan Reymundo Poll. Dub Mikes, M.D.

## 2013-09-01 NOTE — Progress Notes (Signed)
[facility] Adult Case Management Discharge Plan :  Will you be returning to the same living situation after discharge: Yes,  home with boyfriend and son At discharge, do you have transportation home?:Yes,  boyfriend Do you have the ability to pay for your medications:Yes,  mental health  Release of information consent forms completed and in the chart;  Patient's signature needed at discharge.  Patient to Follow up at: Follow-up Information   Follow up with [facility]. (Walk in between 8AM-9AM Monday through Friday for hospital followup/medication management. )    Contact information:   [address], [place] [address] Phone: [phone] Fax: [phone]      Patient denies SI/HI:   Yes,  during group/self report.    Safety Planning and Suicide Prevention discussed:  Yes,  Contact attempts made with pt's mother and boyfriend. SPE completed with pt and pt given SPI pamphlet. She was ecouraged to ask questions, talk about concerns, and share information with support network.   Smart, Sarah Watkins 09/01/2013, 10:12 AM

## 2013-09-01 NOTE — [facility] Suicide Risk Assessment (Signed)
Suicide Risk Assessment  Discharge Assessment     Demographic Factors:  NA  Mental Status Per Nursing Assessment::   On Admission:  NA  Current Mental Status by Physician: In full contact with reality. There are no suicidal ideas, plans or intent. Her mood is euthymic, her affect is appropriate. She is willing and motivated to pursue further outpatient treatment. She is going to continue to work on her coping skills/mindfulness/CBT   Loss Factors: NA  Historical Factors: NA  Risk Reduction Factors:   Responsible for children under 85 years of age, Sense of responsibility to family, Employed, Living with another person, especially a relative and Positive social support  Continued Clinical Symptoms: N/A   Cognitive Features That Contribute To Risk:  Polarized thinking Thought constriction (tunnel vision)    Suicide Risk:  Minimal: No identifiable suicidal ideation.  Patients presenting with no risk factors but with morbid ruminations; may be classified as minimal risk based on the severity of the depressive symptoms  Discharge Diagnoses:   AXIS I:  Adjustment Disorder with Mixed Emotional Features AXIS II:  Deferred AXIS III:   Past Medical History  Diagnosis Date  . HIV (human immunodeficiency virus infection)    AXIS IV:  other psychosocial or environmental problems AXIS V:  61-70 mild symptoms  Plan Of Care/Follow-up recommendations:  Activity:  as tolerated Diet:  regular Follow up outpatient basis Is patient on multiple antipsychotic therapies at discharge:  No   Has Patient had three or more failed trials of antipsychotic monotherapy by history:  No  Recommended Plan for Multiple Antipsychotic Therapies: NA  Jonea Bukowski A 09/01/2013, 4:30 PM

## 2013-09-01 NOTE — Progress Notes (Signed)
Recreation Therapy Notes  Date: 10.02.2014 Time: 2:30.2014 Location: [address] Day[address]  Group Topic: Software engineer Activities (AAA)  Behavioral Response: Did not attend  [employer], LRT/CTRS  Zain Bingman L 09/01/2013 5:18 PM

## 2013-09-01 NOTE — [facility] Group Notes (Signed)
[facility] LCSW Group Therapy  09/01/2013  1:15 PM   Type of Therapy:  Group Therapy  Participation Level:  Active  Participation Quality:  Appropriate and Attentive  Affect:  Appropriate, Calm  Cognitive:  Alert and Appropriate  Insight:  Developing/Improving and Engaged  Engagement in Therapy:  Developing/Improving and Engaged  Modes of Intervention:  Clarification, Confrontation, Discussion, Education, Exploration, Limit-setting, Orientation, Problem-solving, Rapport Building, Dance movement psychotherapist, Socialization and Support  Summary of Progress/Problems: The topic for group was balance in life.  Today's group focused on defining balance in one's own words, identifying things that can knock one off balance, and exploring healthy ways to maintain balance in life. Group members were asked to provide an example of a time when they felt off balance, describe how they handled that situation,and process healthier ways to regain balance in the future. Group members were asked to share the most important tool for maintaining balance that they learned while at [facility] and how they plan to apply this method after discharge.Pt shared that her life is off balance when dealing with finances.  Pt was observed coloring throughout group but appeared to actively listen and engage in group discussion.    Reyes Ivan, LCSWA 09/01/2013 3:05 PM

## 2013-09-02 NOTE — Progress Notes (Signed)
Recreation Therapy Notes  Date: 10.02.2014 Time: 3:00pm Location: [address] Dayroom   Group Topic: Communication, Team Building, Problem Solving  Goal Area(s) Addresses:  Patient will effectively work with peers towards shared goal.  Patient will identify skill used to make activity successful.  Patient will identify how skills used during activity can be used to reach post d/c goals.   Behavioral Response: Engaged, Attentive, Appropriate  Intervention: Problem Solving Activity  Activity: Landing Pad. In teams patients were given 12 plastic drinking straws and a length of masking tape. Using the materials provided patients were asked to build a landing pad to catch a golf ball dropped from approximately 6 feet in the air.   Education: Special educational needs teacher, Team Work, Building control surveyor.   Education Outcome: Acknowledges understanding.   Clinical Observations/Feedback: Patient actively engaged in group activity, working well with her team. Patient offered suggestions to team mates, as well as listening to their ideas. Additionally patient assisted in building team landing pad. Patient contributed to group discussion, identifying skills used during group session as well as accurately applying concepts to her life post d/c.   Marykay Lex Quince Santana, LRT/CTRS  Jayleen Scaglione L 09/02/2013 8:28 AM

## 2013-09-06 NOTE — Progress Notes (Signed)
Patient Discharge Instructions:  After Visit Summary (AVS):   Faxed to:  09/06/13 Discharge Summary Note:   Faxed to:  09/06/13 Psychiatric Admission Assessment Note:   Faxed to:  09/06/13 Suicide Risk Assessment - Discharge Assessment:   Faxed to:  09/06/13 Faxed/Sent to the Next Level Care provider:  09/06/13 Faxed to [facility] @ [phone]  Jerelene Redden, 09/06/2013, 1:48 PM

## 2013-09-08 ENCOUNTER — Other Ambulatory Visit: Payer: Self-pay | Admitting: Licensed Clinical Social Worker

## 2013-09-08 DIAGNOSIS — B20 Human immunodeficiency virus [HIV] disease: Secondary | ICD-10-CM

## 2013-09-08 MED ORDER — ELVITEG-COBIC-EMTRICIT-TENOFDF 150-150-200-300 MG PO TABS: 1.0000 | ORAL_TABLET | Freq: Every day | ORAL | Status: DC

## 2013-09-15 ENCOUNTER — Other Ambulatory Visit (INDEPENDENT_AMBULATORY_CARE_PROVIDER_SITE_OTHER): Payer: Self-pay

## 2013-09-15 ENCOUNTER — Other Ambulatory Visit: Payer: Self-pay | Admitting: Internal Medicine

## 2013-09-15 DIAGNOSIS — B20 Human immunodeficiency virus [HIV] disease: Secondary | ICD-10-CM

## 2013-09-15 LAB — COMPREHENSIVE METABOLIC PANEL
ALT: 12 U/L (ref 0–35)
BUN: 17 mg/dL (ref 6–23)
CO2: 24 mEq/L (ref 19–32)
Calcium: 9.7 mg/dL (ref 8.4–10.5)
Chloride: 106 mEq/L (ref 96–112)
Creat: 0.8 mg/dL (ref 0.50–1.10)
Glucose, Bld: 101 mg/dL — ABNORMAL HIGH (ref 70–99)
Total Bilirubin: 0.4 mg/dL (ref 0.3–1.2)

## 2013-09-15 LAB — CBC WITH DIFFERENTIAL/PLATELET
Eosinophils Absolute: 0.1 10*3/uL (ref 0.0–0.7)
Eosinophils Relative: 1 % (ref 0–5)
HCT: 34.5 % — ABNORMAL LOW (ref 36.0–46.0)
Hemoglobin: 11.8 g/dL — ABNORMAL LOW (ref 12.0–15.0)
Lymphs Abs: 1.8 10*3/uL (ref 0.7–4.0)
MCH: 28.2 pg (ref 26.0–34.0)
MCV: 82.3 fL (ref 78.0–100.0)
Monocytes Absolute: 0.4 10*3/uL (ref 0.1–1.0)
Monocytes Relative: 8 % (ref 3–12)
Neutrophils Relative %: 56 % (ref 43–77)
Platelets: 311 10*3/uL (ref 150–400)
RBC: 4.19 MIL/uL (ref 3.87–5.11)

## 2013-09-16 LAB — T-HELPER CELL (CD4) - (RCID CLINIC ONLY)
CD4 % Helper T Cell: 30 % — ABNORMAL LOW (ref 33–55)
CD4 T Cell Abs: 560 /uL (ref 400–2700)

## 2013-09-16 LAB — HIV-1 RNA QUANT-NO REFLEX-BLD: HIV-1 RNA Quant, Log: <1.3 {Log} (ref <1.30)

## 2013-09-20 ENCOUNTER — Other Ambulatory Visit: Payer: Self-pay | Admitting: *Deleted

## 2013-09-20 DIAGNOSIS — B20 Human immunodeficiency virus [HIV] disease: Secondary | ICD-10-CM

## 2013-09-20 MED ORDER — ELVITEG-COBIC-EMTRICIT-TENOFDF 150-150-200-300 MG PO TABS: 1.0000 | ORAL_TABLET | Freq: Every day | ORAL | Status: DC

## 2013-09-29 ENCOUNTER — Ambulatory Visit: Payer: Self-pay | Admitting: Internal Medicine

## 2013-10-05 ENCOUNTER — Encounter: Payer: Self-pay | Admitting: Internal Medicine

## 2013-10-05 ENCOUNTER — Ambulatory Visit (INDEPENDENT_AMBULATORY_CARE_PROVIDER_SITE_OTHER): Payer: Self-pay | Admitting: Internal Medicine

## 2013-10-05 VITALS — BP 125/87 | HR 80 | Temp 98.5°F | Wt 229.0 lb

## 2013-10-05 DIAGNOSIS — E785 Hyperlipidemia, unspecified: Secondary | ICD-10-CM

## 2013-10-05 DIAGNOSIS — F17210 Nicotine dependence, cigarettes, uncomplicated: Secondary | ICD-10-CM

## 2013-10-05 DIAGNOSIS — F17200 Nicotine dependence, unspecified, uncomplicated: Secondary | ICD-10-CM

## 2013-10-05 DIAGNOSIS — B20 Human immunodeficiency virus [HIV] disease: Secondary | ICD-10-CM

## 2013-10-05 DIAGNOSIS — Z23 Encounter for immunization: Secondary | ICD-10-CM

## 2013-10-05 NOTE — Progress Notes (Signed)
Patient ID: Sarah Watkins, female   DOB: 05-22-1981, 32 y.o.   MRN: [id number]          [facility] for Infectious Disease  Patient Active Problem List   Diagnosis Date Noted  . HIV disease 10/05/2013    Priority: High  . Dyslipidemia 10/05/2013  . Morbid obesity 10/05/2013  . Cigarette smoker 10/05/2013  . Adjustment disorder with mixed anxiety and depressed mood 08/31/2013    Patient's Medications  New Prescriptions   No medications on file  Previous Medications   ELVITEGRAVIR-COBICISTAT-EMTRICITABINE-TENOFOVIR (STRIBILD) 150-150-200-300 MG TABS TABLET    Take 1 tablet by mouth daily with breakfast.   ZOLPIDEM (AMBIEN) 10 MG TABLET    Take 1 tablet (10 mg total) by mouth at bedtime as needed for sleep.  Modified Medications   No medications on file  Discontinued Medications   No medications on file    Subjective: Sarah Watkins is in for her followup visit. She had acute HIV infection diagnosed earlier this year and was started on Stribild in May. She has had no problems obtaining or tolerating Stribild. She takes in the morning with food and does not recall missing any doses. She has good support from her mother and partner. Her partner was diagnosed with HIV at the same time and is also on Stribild and doing well.  She is a current smoker. She has no current plans to quit. He has a family history of hypertension and diabetes. She had partner have talked about having another child. She has one son who was conceived when she was HIV-negative and is healthy. She currently has an IUD that she wants removed. She had partner have been using condoms consistently.  Review of Systems: Constitutional: negative Eyes: negative Ears, nose, mouth, throat, and face: negative Respiratory: positive for cough, negative for asthma, chronic bronchitis, dyspnea on exertion, pleurisy/chest pain and sputum Cardiovascular: negative Gastrointestinal:  negative Genitourinary:negative Behavioral/Psych: positive for anxiety and depression, negative for abusive relationship, excessive alcohol consumption, illegal drug usage, irritability and loss of interest in favorite activities  Past Medical History  Diagnosis Date  . HIV (human immunodeficiency virus infection)     History  Substance Use Topics  . Smoking status: Current Every Day Smoker -- 0.40 packs/day    Types: Cigarettes  . Smokeless tobacco: Never Used  . Alcohol Use: Yes     Comment: occ    Family History  Problem Relation Age of Onset  . Hypertension Mother   . Diabetes Mother   . Diabetes Brother     Allergies  Allergen Reactions  . Sulfa Antibiotics Hives, Itching, Rash and Other (See Comments)    Various gland swelling.    Objective: Temp: 98.5 F (36.9 C) (11/05 1530) Temp src: Oral (11/05 1530) BP: 125/87 mmHg (11/05 1530) Pulse Rate: 80 (11/05 1530)  General: She is in very good spirits. Her body mass index is just over 39 Oral: No oropharyngeal lesions Skin: No rash Lungs: Clear Cor: Regular S1 and S2 with no murmurs Abdomen: Obese, soft and nontender Joints and extremities: Normal Neuro: Alert and fully oriented with normal speech and conversation Mood and affect: Bright  Lab Results HIV 1 RNA Quant (copies/mL)  Date Value  09/15/2013 <20   03/31/2013 9671*     CD4 T Cell Abs (/uL)  Date Value  09/15/2013 560   03/31/2013 530      Lab Results  Component Value Date   WBC 5.2 09/15/2013   HGB 11.8* 09/15/2013  HCT 34.5* 09/15/2013   MCV 82.3 09/15/2013   PLT 311 09/15/2013   BMET    Component Value Date/Time   NA 138 09/15/2013 1112   K 4.0 09/15/2013 1112   CL 106 09/15/2013 1112   CO2 24 09/15/2013 1112   GLUCOSE 101* 09/15/2013 1112   BUN 17 09/15/2013 1112   CREATININE 0.80 09/15/2013 1112   CREATININE 0.72 08/30/2013 1200   CALCIUM 9.7 09/15/2013 1112   GFRNONAA >90 08/30/2013 1200   GFRAA >90 08/30/2013 1200   Lab  Results  Component Value Date   ALT 12 09/15/2013   AST 15 09/15/2013   ALKPHOS 77 09/15/2013   BILITOT 0.4 09/15/2013   Lab Results  Component Value Date   CHOL 225* 03/31/2013   HDL 39* 03/31/2013   LDLCALC 160* 03/31/2013   TRIG 129 03/31/2013   CHOLHDL 5.8 03/31/2013   Assessment: Her HIV infection has come under excellent control. Will have her continue Stribild.  We'll make referral to [facility] for IUD referral and also to discuss planning for potential future pregnancies. She has had normal Pap smears in the past with her last one about one year ago.  I had a lengthy discussion with her about lifestyle modification. She is currently overweight, smoking cigarettes with dyslipidemia and mild hyperglycemia. Will make referral for nutritional counseling.  Plan: 1. Continue Stribild 2. Referral to the OB/GYN clinic 3. Nutrition referral 4. Cigarette cessation counseling provided 5. Lifestyle modification counseling provided 6. Followup after lab work in 3 months   Cliffton Asters, MD [facility] for Infectious Disease [facility] [phone] pager   [phone] cell 10/05/2013, 4:22 PM

## 2013-10-31 ENCOUNTER — Ambulatory Visit: Payer: Self-pay

## 2013-11-08 ENCOUNTER — Encounter: Payer: Self-pay | Admitting: Licensed Clinical Social Worker

## 2013-11-09 ENCOUNTER — Emergency Department (HOSPITAL_COMMUNITY)
Admission: EM | Admit: 2013-11-09 | Discharge: 2013-11-10 | Disposition: A | Discharge status: Home / Self Care | Payer: Medicaid Other | Attending: Emergency Medicine | Admitting: Emergency Medicine

## 2013-11-09 ENCOUNTER — Encounter: Payer: Self-pay | Admitting: Internal Medicine

## 2013-11-09 ENCOUNTER — Ambulatory Visit (INDEPENDENT_AMBULATORY_CARE_PROVIDER_SITE_OTHER): Payer: Medicaid Other | Admitting: Internal Medicine

## 2013-11-09 ENCOUNTER — Emergency Department (HOSPITAL_COMMUNITY): Payer: Medicaid Other

## 2013-11-09 ENCOUNTER — Encounter (HOSPITAL_COMMUNITY): Payer: Self-pay | Admitting: Emergency Medicine

## 2013-11-09 ENCOUNTER — Telehealth: Payer: Self-pay | Admitting: *Deleted

## 2013-11-09 ENCOUNTER — Encounter (INDEPENDENT_AMBULATORY_CARE_PROVIDER_SITE_OTHER): Payer: Self-pay

## 2013-11-09 VITALS — Temp 98.9°F | Ht 64.0 in | Wt 223.0 lb

## 2013-11-09 DIAGNOSIS — Z79899 Other long term (current) drug therapy: Secondary | ICD-10-CM | POA: Insufficient documentation

## 2013-11-09 DIAGNOSIS — F17200 Nicotine dependence, unspecified, uncomplicated: Secondary | ICD-10-CM

## 2013-11-09 DIAGNOSIS — R52 Pain, unspecified: Secondary | ICD-10-CM | POA: Insufficient documentation

## 2013-11-09 DIAGNOSIS — F17210 Nicotine dependence, cigarettes, uncomplicated: Secondary | ICD-10-CM

## 2013-11-09 DIAGNOSIS — B349 Viral infection, unspecified: Secondary | ICD-10-CM

## 2013-11-09 DIAGNOSIS — J111 Influenza due to unidentified influenza virus with other respiratory manifestations: Secondary | ICD-10-CM

## 2013-11-09 DIAGNOSIS — Z21 Asymptomatic human immunodeficiency virus [HIV] infection status: Secondary | ICD-10-CM | POA: Insufficient documentation

## 2013-11-09 DIAGNOSIS — B9789 Other viral agents as the cause of diseases classified elsewhere: Secondary | ICD-10-CM | POA: Insufficient documentation

## 2013-11-09 DIAGNOSIS — Z3202 Encounter for pregnancy test, result negative: Secondary | ICD-10-CM | POA: Insufficient documentation

## 2013-11-09 MED ORDER — OSELTAMIVIR PHOSPHATE 75 MG PO CAPS: 75.0000 mg | ORAL_CAPSULE | Freq: Two times a day (BID) | ORAL | Status: DC

## 2013-11-09 NOTE — Progress Notes (Signed)
   Subjective:    Patient ID: Sarah Watkins, female    DOB: 1981-04-29, 32 y.o.   MRN: [id number]  HPI She comes in for a work in visit. She has a history of HIV diagnosed earlier this year and has been on Stribild. She was recently seen and doing well however did have a hospitalization with behavioral health to do a suicide attempt. She says though has been otherwise stable from a psychiatric standpoint though has not yet had followup. She's here today because she has some coughing and aching with chest discomfort. No fever, no diarrhea, no rash, no sick contacts. She did get a zoster vaccine yesterday for her work.   Review of Systems  Constitutional: Negative for fever, chills, appetite change and fatigue.  HENT: Negative for trouble swallowing.   Respiratory: Positive for cough and chest tightness. Negative for shortness of breath and wheezing.   Gastrointestinal: Negative for nausea and diarrhea.  Musculoskeletal: Positive for myalgias.  Neurological: Negative for dizziness and light-headedness.  Psychiatric/Behavioral: Negative for suicidal ideas.       Objective:   Physical Exam  Constitutional: She appears well-developed and well-nourished. No distress.  Eyes: Right eye exhibits no discharge. Left eye exhibits no discharge. No scleral icterus.  Cardiovascular: Normal rate, regular rhythm and normal heart sounds.   No murmur heard. Pulmonary/Chest: Effort normal and breath sounds normal. No respiratory distress. She has no wheezes. She has no rales.  Lymphadenopathy:    She has no cervical adenopathy.  Skin: No rash noted.  Psychiatric: She has a normal mood and affect.          Assessment & Plan:

## 2013-11-09 NOTE — ED Notes (Signed)
Pt. reports productive cough , chest congestion , generalized body aches and chills onset today , seen at her HIV clinic today diagnosed with Flu - prescribed with Tamiflu but vomitted medication .

## 2013-11-09 NOTE — ED Notes (Addendum)
Pt states body aches started today and a presistant cough started las night. Pt states she went to Md and started Tamiflu. After taking med pt states she has vomited x 4.

## 2013-11-09 NOTE — Assessment & Plan Note (Signed)
Will try empiric Tamiflu for 3 days. If no improvement most likely is non-influenza viral illness. Supportive care otherwise. She will return for her psychiatric care here as well as her routine visit and call if she has any other concerns prior to that.

## 2013-11-09 NOTE — Telephone Encounter (Signed)
Patient called stating she got a varicella vaccine with her employer and since getting the shot she has developed a cough and her chest hurts. She said she looked up the side effects and chest pain was listed. She requested an appt, scheduled for 4:15 today. Wendall Mola

## 2013-11-09 NOTE — Assessment & Plan Note (Signed)
Counseled to quit smoking.  

## 2013-11-10 ENCOUNTER — Ambulatory Visit: Payer: Self-pay | Admitting: *Deleted

## 2013-11-10 LAB — POCT PREGNANCY, URINE: Preg Test, Ur: NEGATIVE

## 2013-11-10 MED ORDER — NAPROXEN 375 MG PO TABS: 375.0000 mg | ORAL_TABLET | Freq: Two times a day (BID) | ORAL | Status: DC

## 2013-11-10 MED ORDER — ONDANSETRON 8 MG PO TBDP: ORAL_TABLET | ORAL | Status: DC

## 2013-11-10 MED ORDER — ONDANSETRON 4 MG PO TBDP
8.0000 mg | ORAL_TABLET | Freq: Once | ORAL | Status: AC
  Administered 2013-11-10: 8 mg via ORAL
  Filled 2013-11-10: qty 2

## 2013-11-10 MED ORDER — KETOROLAC TROMETHAMINE 60 MG/2ML IM SOLN
60.0000 mg | Freq: Once | INTRAMUSCULAR | Status: AC
  Administered 2013-11-10: 60 mg via INTRAMUSCULAR
  Filled 2013-11-10: qty 2

## 2013-11-10 NOTE — ED Provider Notes (Signed)
CSN: [id number]     Arrival date & time 11/09/13  2028 History   First MD Initiated Contact with Patient 11/10/13 0021     Chief Complaint  Patient presents with  . Cough  . Generalized Body Aches   (Consider location/radiation/quality/duration/timing/severity/associated sxs/prior Treatment) Patient is a 32 y.o. female presenting with cough. The history is provided by the patient.  Cough Cough characteristics:  Productive Sputum characteristics:  Nondescript Severity:  Mild Onset quality:  Gradual Duration:  1 day Timing:  Sporadic Progression:  Unchanged Chronicity:  New Smoker: yes   Context: not animal exposure   Relieved by:  Nothing Worsened by:  Nothing tried Ineffective treatments:  None tried Associated symptoms: no chest pain, no eye discharge, no fever, no shortness of breath, no weight loss and no wheezing     Past Medical History  Diagnosis Date  . HIV (human immunodeficiency virus infection)    Past Surgical History  Procedure Laterality Date  . Cesarean section     Family History  Problem Relation Age of Onset  . Hypertension Mother   . Diabetes Mother   . Diabetes Brother    History  Substance Use Topics  . Smoking status: Current Every Day Smoker -- 0.40 packs/day    Types: Cigarettes  . Smokeless tobacco: Never Used  . Alcohol Use: Yes     Comment: occ   OB History   Grav Para Term Preterm Abortions TAB SAB Ect Mult Living                 Review of Systems  Constitutional: Negative for fever and weight loss.  Eyes: Negative for discharge.  Respiratory: Positive for cough. Negative for shortness of breath and wheezing.   Cardiovascular: Negative for chest pain.  All other systems reviewed and are negative.  Seen in ID clinic and prescribed tamiflu and then vomitted after tamiflu.    Allergies  Sulfa antibiotics  Home Medications   Current Outpatient Rx  Name  Route  Sig  Dispense  Refill  .  elvitegravir-cobicistat-emtricitabine-tenofovir (STRIBILD) 150-150-200-300 MG TABS tablet   Oral   Take 1 tablet by mouth daily with breakfast.   30 tablet   6   . ibuprofen (ADVIL,MOTRIN) 200 MG tablet   Oral   Take 400 mg by mouth every 6 (six) hours as needed for headache.         . oseltamivir (TAMIFLU) 75 MG capsule   Oral   Take 1 capsule (75 mg total) by mouth 2 (two) times daily.   6 capsule   0   . VARICELLA VIRUS VACCINE LIVE [place]   Subcutaneous   Inject 1 each into the skin once.         . naproxen (NAPROSYN) 375 MG tablet   Oral   Take 1 tablet (375 mg total) by mouth 2 (two) times daily.   20 tablet   0   . ondansetron (ZOFRAN ODT) 8 MG disintegrating tablet      8mg  ODT q8 hours prn nausea   4 tablet   0    BP 134/59  Pulse 84  Temp(Src) 98.3 F (36.8 C) (Oral)  Resp 16  Ht 5\' 4"  (1.626 m)  Wt 231 lb (104.781 kg)  BMI 39.63 kg/m2  SpO2 96%  LMP 11/02/2013 Physical Exam  Constitutional: She is oriented to person, place, and time. She appears well-developed and well-nourished. No distress.  HENT:  Head: Normocephalic and atraumatic.  Mouth/Throat: No oropharyngeal exudate.  Eyes: Conjunctivae and EOM are normal. Pupils are equal, round, and reactive to light.  Neck: Normal range of motion. Neck supple.  Cardiovascular: Normal rate and regular rhythm.   Pulmonary/Chest: Effort normal and breath sounds normal. No respiratory distress. She has no wheezes. She has no rales. She exhibits no tenderness.  Abdominal: Soft. Bowel sounds are normal. There is no tenderness. There is no rebound and no guarding.  Musculoskeletal: Normal range of motion. She exhibits no edema and no tenderness.  Lymphadenopathy:    She has no cervical adenopathy.  Neurological: She is alert and oriented to person, place, and time.  Skin: Skin is warm and dry.  Psychiatric: She has a normal mood and affect.    ED Course  Procedures (including critical care time) Labs  Review Labs Reviewed  POCT PREGNANCY, URINE   Imaging Review No results found.  EKG Interpretation   None       MDM   1. Viral syndrome    PERC negative.  Will add zofran to tamiflu to keep it down and naproxen for pain follow up with ID.  Return for any concerning symptoms    Jetaun Colbath K Keziah Drotar-Rasch, MD 11/10/13 [phone]

## 2013-11-10 NOTE — ED Notes (Signed)
Md at bedside

## 2013-12-20 ENCOUNTER — Ambulatory Visit: Payer: Self-pay

## 2014-01-05 ENCOUNTER — Other Ambulatory Visit: Payer: Medicaid Other

## 2014-01-05 DIAGNOSIS — B20 Human immunodeficiency virus [HIV] disease: Secondary | ICD-10-CM

## 2014-01-05 LAB — COMPLETE METABOLIC PANEL WITH GFR
ALT: 9 U/L (ref 0–35)
AST: 16 U/L (ref 0–37)
Albumin: 4.6 g/dL (ref 3.5–5.2)
Alkaline Phosphatase: 96 U/L (ref 39–117)
BUN: 11 mg/dL (ref 6–23)
CALCIUM: 9.7 mg/dL (ref 8.4–10.5)
CHLORIDE: 104 meq/L (ref 96–112)
CO2: 24 mEq/L (ref 19–32)
CREATININE: 0.79 mg/dL (ref 0.50–1.10)
GFR, Est African American: >89 mL/min
GFR, Est Non African American: >89 mL/min
Glucose, Bld: 102 mg/dL — ABNORMAL HIGH (ref 70–99)
POTASSIUM: 3.8 meq/L (ref 3.5–5.3)
Sodium: 137 mEq/L (ref 135–145)
Total Bilirubin: 0.3 mg/dL (ref 0.2–1.2)
Total Protein: 8.1 g/dL (ref 6.0–8.3)

## 2014-01-05 LAB — CBC
HCT: 34.9 % — ABNORMAL LOW (ref 36.0–46.0)
Hemoglobin: 11.6 g/dL — ABNORMAL LOW (ref 12.0–15.0)
MCH: 27 pg (ref 26.0–34.0)
MCHC: 33.2 g/dL (ref 30.0–36.0)
MCV: 81.2 fL (ref 78.0–100.0)
PLATELETS: 325 10*3/uL (ref 150–400)
RBC: 4.3 MIL/uL (ref 3.87–5.11)
RDW: 14.5 % (ref 11.5–15.5)
WBC: 5.9 10*3/uL (ref 4.0–10.5)

## 2014-01-05 LAB — LIPID PANEL
Cholesterol: 208 mg/dL — ABNORMAL HIGH (ref 0–200)
HDL: 36 mg/dL — ABNORMAL LOW (ref >39)
LDL CALC: 149 mg/dL — AB (ref 0–99)
Total CHOL/HDL Ratio: 5.8 Ratio
Triglycerides: 114 mg/dL (ref <150)
VLDL: 23 mg/dL (ref 0–40)

## 2014-01-06 LAB — T-HELPER CELL (CD4) - (RCID CLINIC ONLY)
CD4 % Helper T Cell: 35 % (ref 33–55)
CD4 T Cell Abs: 800 /uL (ref 400–2700)

## 2014-01-08 LAB — HIV-1 RNA QUANT-NO REFLEX-BLD: HIV 1 RNA Quant: <20 copies/mL (ref <20)

## 2014-01-19 ENCOUNTER — Encounter: Payer: Self-pay | Admitting: Internal Medicine

## 2014-01-19 ENCOUNTER — Ambulatory Visit (INDEPENDENT_AMBULATORY_CARE_PROVIDER_SITE_OTHER): Payer: Medicaid Other | Admitting: Internal Medicine

## 2014-01-19 VITALS — BP 142/78 | HR 86 | Temp 98.0°F | Ht 64.0 in | Wt 237.0 lb

## 2014-01-19 DIAGNOSIS — F17200 Nicotine dependence, unspecified, uncomplicated: Secondary | ICD-10-CM

## 2014-01-19 DIAGNOSIS — B20 Human immunodeficiency virus [HIV] disease: Secondary | ICD-10-CM

## 2014-01-19 DIAGNOSIS — F17210 Nicotine dependence, cigarettes, uncomplicated: Secondary | ICD-10-CM

## 2014-01-19 NOTE — Progress Notes (Signed)
Subjective:    Patient ID: Sarah Watkins, female    DOB: 05/15/1981, 33 y.o.   MRN: [id number]  HPI 33yo F with HIV Disease, Cd 4 count of 800/ VL <20 on stribild. Reports 100% adherence. She was last seen in December for presumed flu, given oseltamivir to help with symptoms. Now totally improved.  Had pap at health dept since she is interested in getting on birth control. Health dept is going to remove IU, possibly exchange today at health dept. She is getting back together with her ex, who is HIV +, not currently on ART.  Concerned about rash to right chin  Current Outpatient Prescriptions on File Prior to Visit  Medication Sig Dispense Refill  . elvitegravir-cobicistat-emtricitabine-tenofovir (STRIBILD) 150-150-200-300 MG TABS tablet Take 1 tablet by mouth daily with breakfast.  30 tablet  6  . ibuprofen (ADVIL,MOTRIN) 200 MG tablet Take 400 mg by mouth every 6 (six) hours as needed for headache.      . naproxen (NAPROSYN) 375 MG tablet Take 1 tablet (375 mg total) by mouth 2 (two) times daily.  20 tablet  0  . ondansetron (ZOFRAN ODT) 8 MG disintegrating tablet  ODT q8 hours prn nausea  4 tablet  0  . oseltamivir (TAMIFLU) 75 MG capsule Take 1 capsule (75 mg total) by mouth 2 (two) times daily.  6 capsule  0  . VARICELLA VIRUS VACCINE LIVE [place] Inject 1 each into the skin once.       No current facility-administered medications on file prior to visit.   Active Ambulatory Problems    Diagnosis Date Noted  . Adjustment disorder with mixed anxiety and depressed mood 08/31/2013  . HIV disease 10/05/2013  . Dyslipidemia 10/05/2013  . Morbid obesity 10/05/2013  . Cigarette smoker 10/05/2013  . Influenza-like illness 11/09/2013   Resolved Ambulatory Problems    Diagnosis Date Noted  . No Resolved Ambulatory Problems   Past Medical History  Diagnosis Date  . HIV (human immunodeficiency virus infection)        Review of Systems     Objective:   Physical Exam BP  142/78  Pulse 86  Temp(Src) 98 F (36.7 C) (Oral)  Ht  (1.626 m)  Wt 237 lb (107.502 kg)  BMI 40.66 kg/m2 Physical Exam  Constitutional:  oriented to person, place, and time. appears well-developed and well-nourished. No distress.  HENT: small pinpoint papules to left lower chin Mouth/Throat: Oropharynx is clear and moist. No oropharyngeal exudate.  Cardiovascular: Normal rate, regular rhythm and normal heart sounds. Exam reveals no gallop and no friction rub.  No murmur heard.  Pulmonary/Chest: Effort normal and breath sounds normal. No respiratory distress.  has no wheezes.  Abdominal: Soft. Bowel sounds are normal.  exhibits no distension. There is no tenderness.  Lymphadenopathy: no cervical adenopathy.  Neurological: alert and oriented to person, place, and time.  Skin: Skin is warm and dry. No rash noted. No erythema.  Psychiatric: a normal mood and affect.  behavior is normal.       Assessment & Plan:  hiv = well controlled, continue with stribild  Weight gain = seeing nutritionist to help with diet modification. Could be inpart as stribild side effect.  Smoking cessation = now smoking 1 pack per week. Currently smoking 3-4 per day. Counseled for 3 min on smoking cessation  htn = mildly elevated, but will see what her BP looks at next visit to start anti hypertensive. She is going to try diet modification.  Decrease salt intake  ? Fungal infection to face = does not appear to be c/w acne. Lesions are quite small. Will do empiric trial of topical antifungal OTC BID x 10 days to see if it improves  Health maintenance = use condoms, get IUD placed, getting pap today   rtc in 24mo

## 2014-01-30 ENCOUNTER — Telehealth: Payer: Self-pay | Admitting: *Deleted

## 2014-01-30 ENCOUNTER — Ambulatory Visit: Payer: Self-pay

## 2014-01-30 ENCOUNTER — Emergency Department (HOSPITAL_COMMUNITY)
Admission: EM | Admit: 2014-01-30 | Discharge: 2014-01-30 | Disposition: A | Discharge status: Home / Self Care | Payer: Medicaid Other | Attending: Emergency Medicine | Admitting: Emergency Medicine

## 2014-01-30 ENCOUNTER — Encounter (HOSPITAL_COMMUNITY): Payer: Self-pay | Admitting: Emergency Medicine

## 2014-01-30 DIAGNOSIS — R197 Diarrhea, unspecified: Secondary | ICD-10-CM | POA: Insufficient documentation

## 2014-01-30 DIAGNOSIS — Z21 Asymptomatic human immunodeficiency virus [HIV] infection status: Secondary | ICD-10-CM | POA: Insufficient documentation

## 2014-01-30 DIAGNOSIS — B20 Human immunodeficiency virus [HIV] disease: Secondary | ICD-10-CM

## 2014-01-30 DIAGNOSIS — R1110 Vomiting, unspecified: Secondary | ICD-10-CM

## 2014-01-30 DIAGNOSIS — R112 Nausea with vomiting, unspecified: Secondary | ICD-10-CM | POA: Insufficient documentation

## 2014-01-30 DIAGNOSIS — Z79899 Other long term (current) drug therapy: Secondary | ICD-10-CM | POA: Insufficient documentation

## 2014-01-30 DIAGNOSIS — F17200 Nicotine dependence, unspecified, uncomplicated: Secondary | ICD-10-CM | POA: Insufficient documentation

## 2014-01-30 DIAGNOSIS — R109 Unspecified abdominal pain: Secondary | ICD-10-CM | POA: Insufficient documentation

## 2014-01-30 DIAGNOSIS — Z3202 Encounter for pregnancy test, result negative: Secondary | ICD-10-CM | POA: Insufficient documentation

## 2014-01-30 LAB — COMPREHENSIVE METABOLIC PANEL
ALK PHOS: 100 U/L (ref 39–117)
ALT: 10 U/L (ref 0–35)
AST: 17 U/L (ref 0–37)
Albumin: 4.2 g/dL (ref 3.5–5.2)
BILIRUBIN TOTAL: 0.3 mg/dL (ref 0.3–1.2)
BUN: 10 mg/dL (ref 6–23)
CHLORIDE: 104 meq/L (ref 96–112)
CO2: 17 meq/L — AB (ref 19–32)
CREATININE: 0.7 mg/dL (ref 0.50–1.10)
Calcium: 9.7 mg/dL (ref 8.4–10.5)
GFR calc Af Amer: >90 mL/min (ref >90)
GLUCOSE: 96 mg/dL (ref 70–99)
Potassium: 3.2 mEq/L — ABNORMAL LOW (ref 3.7–5.3)
Sodium: 137 mEq/L (ref 137–147)
Total Protein: 8.9 g/dL — ABNORMAL HIGH (ref 6.0–8.3)

## 2014-01-30 LAB — CBC WITH DIFFERENTIAL/PLATELET
Basophils Absolute: 0 10*3/uL (ref 0.0–0.1)
Basophils Relative: 0 % (ref 0–1)
Eosinophils Absolute: 0 10*3/uL (ref 0.0–0.7)
Eosinophils Relative: 1 % (ref 0–5)
HEMATOCRIT: 36.7 % (ref 36.0–46.0)
Hemoglobin: 12.6 g/dL (ref 12.0–15.0)
LYMPHS ABS: 0.7 10*3/uL (ref 0.7–4.0)
LYMPHS PCT: 13 % (ref 12–46)
MCH: 28 pg (ref 26.0–34.0)
MCHC: 34.3 g/dL (ref 30.0–36.0)
MCV: 81.6 fL (ref 78.0–100.0)
MONO ABS: 0.5 10*3/uL (ref 0.1–1.0)
Monocytes Relative: 10 % (ref 3–12)
NEUTROS ABS: 4.1 10*3/uL (ref 1.7–7.7)
Neutrophils Relative %: 76 % (ref 43–77)
Platelets: 277 10*3/uL (ref 150–400)
RBC: 4.5 MIL/uL (ref 3.87–5.11)
RDW: 14.1 % (ref 11.5–15.5)
WBC: 5.4 10*3/uL (ref 4.0–10.5)

## 2014-01-30 LAB — URINE MICROSCOPIC-ADD ON

## 2014-01-30 LAB — URINALYSIS, ROUTINE W REFLEX MICROSCOPIC
Glucose, UA: NEGATIVE mg/dL
KETONES UR: NEGATIVE mg/dL
Leukocytes, UA: NEGATIVE
Nitrite: NEGATIVE
PROTEIN: NEGATIVE mg/dL
Specific Gravity, Urine: 1.03 (ref 1.005–1.030)
UROBILINOGEN UA: 0.2 mg/dL (ref 0.0–1.0)
pH: 5.5 (ref 5.0–8.0)

## 2014-01-30 LAB — CLOSTRIDIUM DIFFICILE BY PCR: CDIFFPCR: NEGATIVE

## 2014-01-30 LAB — POC URINE PREG, ED: PREG TEST UR: NEGATIVE

## 2014-01-30 MED ORDER — ONDANSETRON HCL 4 MG PO TABS: 4.0000 mg | ORAL_TABLET | Freq: Four times a day (QID) | ORAL | Status: DC

## 2014-01-30 MED ORDER — ONDANSETRON 4 MG PO TBDP
ORAL_TABLET | ORAL | Status: DC
Start: 2014-01-30 — End: 2014-01-31
  Filled 2014-01-30: qty 2

## 2014-01-30 MED ORDER — ONDANSETRON 4 MG PO TBDP
8.0000 mg | ORAL_TABLET | Freq: Once | ORAL | Status: AC
  Administered 2014-01-30: 8 mg via ORAL

## 2014-01-30 NOTE — Telephone Encounter (Signed)
Spoke with ED Triage RN.  Shared Dr. Feliz BeamSnider's recommendation for Stool Studies.

## 2014-01-30 NOTE — Telephone Encounter (Signed)
Dr. Drue SecondSnider recommending stool studies Giardia, C.Diff and stool culture. Do not refill loperamide

## 2014-01-30 NOTE — Discharge Instructions (Signed)
Diarrhea Diarrhea is frequent loose and watery bowel movements. It can cause you to feel weak and dehydrated. Dehydration can cause you to become tired and thirsty, have a dry mouth, and have decreased urination that often is dark yellow. Diarrhea is a sign of another problem, most often an infection that will not last long. In most cases, diarrhea typically lasts 2 3 days. However, it can last longer if it is a sign of something more serious. It is important to treat your diarrhea as directed by your caregive to lessen or prevent future episodes of diarrhea. CAUSES  Some common causes include:  Gastrointestinal infections caused by viruses, bacteria, or parasites.  Food poisoning or food allergies.  Certain medicines, such as antibiotics, chemotherapy, and laxatives.  Artificial sweeteners and fructose.  Digestive disorders. HOME CARE INSTRUCTIONS  Ensure adequate fluid intake (hydration): have 1 cup (8 oz) of fluid for each diarrhea episode. Avoid fluids that contain simple sugars or sports drinks, fruit juices, whole milk products, and sodas. Your urine should be clear or pale yellow if you are drinking enough fluids. Hydrate with an oral rehydration solution that you can purchase at pharmacies, retail stores, and online. You can prepare an oral rehydration solution at home by mixing the following ingredients together:    tsp table salt.   tsp baking soda.   tsp salt substitute containing potassium chloride.  1  tablespoons sugar.  1 L (34 oz) of water.  Certain foods and beverages may increase the speed at which food moves through the gastrointestinal (GI) tract. These foods and beverages should be avoided and include:  Caffeinated and alcoholic beverages.  High-fiber foods, such as raw fruits and vegetables, nuts, seeds, and whole grain breads and cereals.  Foods and beverages sweetened with sugar alcohols, such as xylitol, sorbitol, and mannitol.  Some foods may be well  tolerated and may help thicken stool including:  Starchy foods, such as rice, toast, pasta, low-sugar cereal, oatmeal, grits, baked potatoes, crackers, and bagels.  Bananas.  Applesauce.  Add probiotic-rich foods to help increase healthy bacteria in the GI tract, such as yogurt and fermented milk products.  Wash your hands well after each diarrhea episode.  Only take over-the-counter or prescription medicines as directed by your caregiver.  Take a warm bath to relieve any burning or pain from frequent diarrhea episodes. SEEK IMMEDIATE MEDICAL CARE IF:   You are unable to keep fluids down.  You have persistent vomiting.  You have blood in your stool, or your stools are black and tarry.  You do not urinate in 6 8 hours, or there is only a small amount of very dark urine.  You have abdominal pain that increases or localizes.  You have weakness, dizziness, confusion, or lightheadedness.  You have a severe headache.  Your diarrhea gets worse or does not get better.  You have a fever or persistent symptoms for more than 2 3 days.  You have a fever and your symptoms suddenly get worse. MAKE SURE YOU:   Understand these instructions.  Will watch your condition.  Will get help right away if you are not doing well or get worse. Document Released: 11/07/2002 Document Revised: 11/03/2012 Document Reviewed: 07/25/2012 [facility] Patient Information 2014 [place], [place].  Nausea and Vomiting  Nausea is a sick feeling that often comes before throwing up (vomiting). Vomiting is a reflex where stomach contents come out of your mouth. Vomiting can cause severe loss of body fluids (dehydration). Children and elderly adults can  become dehydrated quickly, especially if they also have diarrhea. Nausea and vomiting are symptoms of a condition or disease. It is important to find the cause of your symptoms.  CAUSES  Direct irritation of the stomach lining. This irritation can result from  increased acid production (gastroesophageal reflux disease), infection, food poisoning, taking certain medicines (such as nonsteroidal anti-inflammatory drugs), alcohol use, or tobacco use.  Signals from the brain. These signals could be caused by a headache, heat exposure, an inner ear disturbance, increased pressure in the brain from injury, infection, a tumor, or a concussion, pain, emotional stimulus, or metabolic problems.  An obstruction in the gastrointestinal tract (bowel obstruction).  Illnesses such as diabetes, hepatitis, gallbladder problems, appendicitis, kidney problems, cancer, sepsis, atypical symptoms of a heart attack, or eating disorders.  Medical treatments such as chemotherapy and radiation.  Receiving medicine that makes you sleep (general anesthetic) during surgery. DIAGNOSIS  Your caregiver may ask for tests to be done if the problems do not improve after a few days. Tests may also be done if symptoms are severe or if the reason for the nausea and vomiting is not clear. Tests may include:  Urine tests.  Blood tests.  Stool tests.  Cultures (to look for evidence of infection).  X-rays or other imaging studies. Test results can help your caregiver make decisions about treatment or the need for additional tests.  TREATMENT  You need to stay well hydrated. Drink frequently but in small amounts. You may wish to drink water, sports drinks, clear broth, or eat frozen ice pops or gelatin dessert to help stay hydrated. When you eat, eating slowly may help prevent nausea. There are also some antinausea medicines that may help prevent nausea.  HOME CARE INSTRUCTIONS  Take all medicine as directed by your caregiver.  If you do not have an appetite, do not force yourself to eat. However, you must continue to drink fluids.  If you have an appetite, eat a normal diet unless your caregiver tells you differently.  Eat a variety of complex carbohydrates (rice, wheat, potatoes, bread), lean  meats, yogurt, fruits, and vegetables.  Avoid high-fat foods because they are more difficult to digest.  Drink enough water and fluids to keep your urine clear or pale yellow.  If you are dehydrated, ask your caregiver for specific rehydration instructions. Signs of dehydration may include:  Severe thirst.  Dry lips and mouth.  Dizziness.  Dark urine.  Decreasing urine frequency and amount.  Confusion.  Rapid breathing or pulse. SEEK IMMEDIATE MEDICAL CARE IF:  You have blood or brown flecks (like coffee grounds) in your vomit.  You have black or bloody stools.  You have a severe headache or stiff neck.  You are confused.  You have severe abdominal pain.  You have chest pain or trouble breathing.  You do not urinate at least once every 8 hours.  You develop cold or clammy skin.  You continue to vomit for longer than 24 to 48 hours.  You have a fever. MAKE SURE YOU:  Understand these instructions.  Will watch your condition.  Will get help right away if you are not doing well or get worse. Document Released: 11/17/2005 Document Revised: 02/09/2012 Document Reviewed: 04/16/2011  [facility] Patient Information 2014 [place], [place].    You have cultures pending.  You need to follow up with your Infectious Disease Doctor within 3 days.  Return to emergency department with abdominal pain or vomiting.

## 2014-01-30 NOTE — Telephone Encounter (Signed)
MD please advise about Zofran and loperamide refills

## 2014-01-30 NOTE — ED Notes (Signed)
Per pt sts 4 days of nausea, vomiting and diarrhea. sts just today she has had a BM 8 times. sts she feels weak.

## 2014-01-30 NOTE — ED Provider Notes (Signed)
CSN: [id number]     Arrival date & time 01/30/14  1247 History   First MD Initiated Contact with Patient 01/30/14 2021     Chief Complaint  Patient presents with  . Nausea  . Diarrhea     (Consider location/radiation/quality/duration/timing/severity/associated sxs/prior Treatment) HPI Comments: 33 yo AA female with h/o HIV presents to ER with 4 day h/o n/v/d.  Pt has had difficulty with PO intolerance.    CD4 counts are good.  Viral load indetectable.   Followed by Dr. Ilsa Iha in ID.   Pt isn't taking Rx for n/v/d.    No recent travel.  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  No poisonous or spoiled foods.    Patient is a 33 y.o. female presenting with diarrhea. The history is provided by the patient.  Diarrhea Severity:  Mild Onset quality:  Gradual Number of episodes:  Several Duration:  4 days Timing:  Intermittent Progression:  Unchanged Relieved by:  None tried Worsened by:  Nothing tried Ineffective treatments:  None tried Associated symptoms: abdominal pain and vomiting   Associated symptoms: no arthralgias, no chills, no recent cough, no diaphoresis, no fever, no headaches, no myalgias and no URI   Associated symptoms comment:  Mild abdominal cramping Vomiting:    Quality:  Stomach contents   Number of occurrences:  Several   Severity:  Mild   Duration:  4 days   Timing:  Intermittent Risk factors: sick contacts   Risk factors: no recent antibiotic use, no suspicious food intake and no travel to endemic areas     Past Medical History  Diagnosis Date  . HIV (human immunodeficiency virus infection)    Past Surgical History  Procedure Laterality Date  . Cesarean section     Family History  Problem Relation Age of Onset  . Hypertension Mother   . Diabetes Mother   . Diabetes Brother    History  Substance Use Topics  . Smoking status: Current Every Day Smoker -- 0.40 packs/day    Types: Cigarettes  . Smokeless  tobacco: Never Used  . Alcohol Use: Yes     Comment: occ   OB History   Grav Para Term Preterm Abortions TAB SAB Ect Mult Living                 Review of Systems  Constitutional: Negative.  Negative for fever, chills and diaphoresis.  HENT: Negative.   Eyes: Negative.   Respiratory: Negative.   Cardiovascular: Negative.   Gastrointestinal: Positive for vomiting, abdominal pain and diarrhea. Negative for constipation, blood in stool, abdominal distention and anal bleeding.       Mild abd discomfort  Endocrine: Negative.   Genitourinary: Negative.        LMP: 1 wk ago, denies pregnancy  Musculoskeletal: Negative.  Negative for arthralgias and myalgias.  Allergic/Immunologic: Negative.   Neurological: Negative.  Negative for headaches.  All other systems reviewed and are negative.      Allergies  Sulfa antibiotics  Home Medications   Current Outpatient Rx  Name  Route  Sig  Dispense  Refill  . elvitegravir-cobicistat-emtricitabine-tenofovir (STRIBILD) 150-150-200-300 MG TABS tablet   Oral   Take 1 tablet by mouth daily with breakfast.   30 tablet   6   . ibuprofen (ADVIL,MOTRIN) 200 MG tablet   Oral   Take 400 mg by mouth every 6 (six) hours as needed for headache.         Marland Kitchen  levonorgestrel (MIRENA) 20 MCG/24HR IUD   Intrauterine   1 each by Intrauterine route once.          BP 135/70  Pulse 85  Temp(Src) 98.3 F (36.8 C) (Oral)  Resp 18  Ht 5\' 4"  (1.626 m)  Wt 237 lb (107.502 kg)  BMI 40.66 kg/m2  SpO2 98%  LMP 01/01/2014 Physical Exam  Nursing note and vitals reviewed. Constitutional: She is oriented to person, place, and time. She appears well-developed and well-nourished.  Smiling and joking with me and her family, in nad  HENT:  Head: Normocephalic and atraumatic.  Eyes: Conjunctivae and EOM are normal. Pupils are equal, round, and reactive to light. Right eye exhibits no discharge. Left eye exhibits no discharge.  Neck: Normal range of  motion. Neck supple. No JVD present.  Cardiovascular: Normal rate and regular rhythm.   Pulmonary/Chest: Effort normal and breath sounds normal. No stridor. She has no wheezes. She has no rales.  Abdominal: Soft. Bowel sounds are normal. She exhibits no distension and no mass. There is no tenderness. There is no rebound and no guarding.  Genitourinary:  Pt denies gyn symptoms no pelvic exam performed  Musculoskeletal: Normal range of motion. She exhibits no edema and no tenderness.  Neurological: She is alert and oriented to person, place, and time. She has normal reflexes.  Skin: Skin is warm and dry.    ED Course  Procedures (including critical care time) Labs Review Labs Reviewed  COMPREHENSIVE METABOLIC PANEL - Abnormal; Notable for the following:    Potassium 3.2 (*)    CO2 17 (*)    Total Protein 8.9 (*)    All other components within normal limits  URINALYSIS, ROUTINE W REFLEX MICROSCOPIC - Abnormal; Notable for the following:    APPearance TURBID (*)    Hgb urine dipstick MODERATE (*)    Bilirubin Urine SMALL (*)    All other components within normal limits  URINE MICROSCOPIC-ADD ON - Abnormal; Notable for the following:    Bacteria, UA FEW (*)    All other components within normal limits  CLOSTRIDIUM DIFFICILE BY PCR  STOOL CULTURE  CBC WITH DIFFERENTIAL  GIARDIA/CRYPTOSPORIDIUM SCREEN(EIA)  POC URINE PREG, ED   Imaging Review No results found.   EKG Interpretation None      MDM   Final diagnoses:  HIV disease  Vomiting  Diarrhea    33 year old African female with past medical history of HIV who is immunocompromised and based on her recent lab work per her presents emergency department with nausea vomiting and diarrhea. 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 She denies fever, chills, headache, chest pain, shortness of breath, frequency urgency dysuria, significant abdominal pain other than cramping, vaginal symptoms. She is  pleasant, choking, and in no acute distress. Vital signs are stable. She is afebrile. Her physical exam is benign. Triage labwork included CBC, CMP, urinalysis, pregnancy, C. difficile, Giardia, stool culture. Labwork largely normal with the exception of few bacteria on UA however nitrates and leukocytes are negative and she just finished her menses yesterday.  She has no urinary symptoms and therefore I will not treat this UA finding.  Patient was given Zofran in triage and her symptoms significantly improved. She tolerated a by mouth challenge.  No indication for antibiotics, imaging, or further workup at this time.  Plan to discharge her with Zofran. She is aware she has stool cultures and Giardia labwork pending. Strict ER precautions were given for  fever, urinary symptoms, abdominal pain, by mouth intolerance, or other concerns. She will followup with her infectious disease doctor within the next couple days. If unable to obtain follow up appointment she should return emergency department for any concerns. The patient and her family agree with plan for discharge and followup.     Darlys Galesavid Kasey Hansell, MD 01/30/14 [phone]

## 2014-01-30 NOTE — ED Notes (Signed)
Dr Drue SecondSnider is her ID MD,  Patient has had 6 days of diarrhea.  He is requesting giardia, c-diff, stool culture and no immodium to be given

## 2014-01-31 LAB — GIARDIA/CRYPTOSPORIDIUM SCREEN(EIA)
Cryptosporidium Screen (EIA): NEGATIVE
Giardia Screen - EIA: NEGATIVE

## 2014-02-03 LAB — STOOL CULTURE

## 2014-04-11 ENCOUNTER — Other Ambulatory Visit: Payer: Medicaid Other

## 2014-04-11 DIAGNOSIS — Z79899 Other long term (current) drug therapy: Secondary | ICD-10-CM

## 2014-04-11 DIAGNOSIS — B20 Human immunodeficiency virus [HIV] disease: Secondary | ICD-10-CM

## 2014-04-11 DIAGNOSIS — Z113 Encounter for screening for infections with a predominantly sexual mode of transmission: Secondary | ICD-10-CM

## 2014-04-11 LAB — CBC WITH DIFFERENTIAL/PLATELET
BASOS PCT: 0 % (ref 0–1)
Basophils Absolute: 0 10*3/uL (ref 0.0–0.1)
EOS ABS: 0.1 10*3/uL (ref 0.0–0.7)
EOS PCT: 1 % (ref 0–5)
HCT: 33.8 % — ABNORMAL LOW (ref 36.0–46.0)
HEMOGLOBIN: 11.2 g/dL — AB (ref 12.0–15.0)
LYMPHS ABS: 2.7 10*3/uL (ref 0.7–4.0)
Lymphocytes Relative: 45 % (ref 12–46)
MCH: 26.8 pg (ref 26.0–34.0)
MCHC: 33.1 g/dL (ref 30.0–36.0)
MCV: 80.9 fL (ref 78.0–100.0)
MONO ABS: 0.4 10*3/uL (ref 0.1–1.0)
MONOS PCT: 7 % (ref 3–12)
Neutro Abs: 2.8 10*3/uL (ref 1.7–7.7)
Neutrophils Relative %: 47 % (ref 43–77)
Platelets: 289 10*3/uL (ref 150–400)
RBC: 4.18 MIL/uL (ref 3.87–5.11)
RDW: 14.6 % (ref 11.5–15.5)
WBC: 5.9 10*3/uL (ref 4.0–10.5)

## 2014-04-11 LAB — COMPREHENSIVE METABOLIC PANEL
ALK PHOS: 80 U/L (ref 39–117)
ALT: 9 U/L (ref 0–35)
AST: 13 U/L (ref 0–37)
Albumin: 4.3 g/dL (ref 3.5–5.2)
BILIRUBIN TOTAL: 0.2 mg/dL (ref 0.2–1.2)
BUN: 15 mg/dL (ref 6–23)
CO2: 24 meq/L (ref 19–32)
CREATININE: 0.75 mg/dL (ref 0.50–1.10)
Calcium: 9.5 mg/dL (ref 8.4–10.5)
Chloride: 107 mEq/L (ref 96–112)
GLUCOSE: 102 mg/dL — AB (ref 70–99)
Potassium: 3.9 mEq/L (ref 3.5–5.3)
Sodium: 138 mEq/L (ref 135–145)
Total Protein: 7.2 g/dL (ref 6.0–8.3)

## 2014-04-12 LAB — RPR

## 2014-04-12 LAB — HIV-1 RNA QUANT-NO REFLEX-BLD

## 2014-04-12 LAB — T-HELPER CELL (CD4) - (RCID CLINIC ONLY)
CD4 T CELL ABS: 840 /uL (ref 400–2700)
CD4 T CELL HELPER: 31 % — AB (ref 33–55)

## 2014-04-25 ENCOUNTER — Ambulatory Visit: Payer: Self-pay | Admitting: Internal Medicine

## 2014-05-18 ENCOUNTER — Encounter: Payer: Self-pay | Admitting: Internal Medicine

## 2014-05-18 ENCOUNTER — Ambulatory Visit (INDEPENDENT_AMBULATORY_CARE_PROVIDER_SITE_OTHER): Payer: Medicaid Other | Admitting: Internal Medicine

## 2014-05-18 VITALS — BP 161/94 | HR 78 | Temp 98.2°F | Wt 240.0 lb

## 2014-05-18 DIAGNOSIS — R5381 Other malaise: Secondary | ICD-10-CM

## 2014-05-18 DIAGNOSIS — I10 Essential (primary) hypertension: Secondary | ICD-10-CM

## 2014-05-18 DIAGNOSIS — R5383 Other fatigue: Secondary | ICD-10-CM

## 2014-05-18 LAB — T4, FREE: Free T4: 0.91 ng/dL (ref 0.80–1.80)

## 2014-05-18 LAB — TSH: TSH: 2.169 u[IU]/mL (ref 0.350–4.500)

## 2014-05-18 MED ORDER — HYDROCHLOROTHIAZIDE 25 MG PO TABS: 25.0000 mg | ORAL_TABLET | Freq: Every day | ORAL | Status: DC

## 2014-05-18 NOTE — Progress Notes (Signed)
   Subjective:    Patient ID: Sarah Watkins, female    DOB: [birth year], 33 y.o.   MRN: [id number]  HPI Althea 33yo F with HIV, CD 4 count of 840/VL<20, on stribild. Reports having increasing fatigue. Also concerned about weight gain. No recent dietary changes. Has not met with dietician yet. Has not had menstrual cycle this month, but states she uses intermittent condoms with HIV+ partner. Sleeps 7-8 hrs a day but works 3rd shift  Current Outpatient Prescriptions on File Prior to Visit  Medication Sig Dispense Refill  . elvitegravir-cobicistat-emtricitabine-tenofovir (STRIBILD) 150-150-200-300 MG TABS tablet Take 1 tablet by mouth daily with breakfast.  30 tablet  6  . ibuprofen (ADVIL,MOTRIN) 200 MG tablet Take 400 mg by mouth every 6 (six) hours as needed for headache.      . levonorgestrel (MIRENA) 20 MCG/24HR IUD 1 each by Intrauterine route once.      . ondansetron (ZOFRAN) 4 MG tablet Take 1 tablet (4 mg total) by mouth every 6 (six) hours.  12 tablet  0   No current facility-administered medications on file prior to visit.   Active Ambulatory Problems    Diagnosis Date Noted  . Adjustment disorder with mixed anxiety and depressed mood 08/31/2013  . HIV disease 10/05/2013  . Dyslipidemia 10/05/2013  . Morbid obesity 10/05/2013  . Cigarette smoker 10/05/2013  . Influenza-like illness 11/09/2013   Resolved Ambulatory Problems    Diagnosis Date Noted  . No Resolved Ambulatory Problems   Past Medical History  Diagnosis Date  . HIV (human immunodeficiency virus infection)        Review of Systems - other than weight gain, fatigue, 10 point ros is negative Constitutional: Negative for fever, chills, diaphoresis, activity change, appetite change, fatigue and unexpected weight change.  HENT: Negative for congestion, sore throat, rhinorrhea, sneezing, trouble swallowing and sinus pressure.  Cardiovascular: positive for leg swelling at end of the day  Gastrointestinal:  Negative for nausea, vomiting, abdominal pain, diarrhea, constipation, blood in stool, abdominal distention and anal bleeding.   Skin: Negative for color change, pallor, rash and wound.        Objective:   Physical Exam BP 161/94  Pulse 78  Temp(Src) 98.2 F (36.8 C) (Oral)  Wt 240 lb (108.863 kg)  LMP 04/03/2014  Physical Exam  Constitutional:  oriented to person, place, and time. appears well-developed and well-nourished. No distress.  HENT:  Mouth/Throat: Oropharynx is clear and moist. No oropharyngeal exudate.  Cardiovascular: Normal rate, regular rhythm and normal heart sounds. Exam reveals no gallop and no friction rub.  No murmur heard.  Pulmonary/Chest: Effort normal and breath sounds normal. No respiratory distress.  has no wheezes.  Lymphadenopathy: no cervical adenopathy.  Neurological: alert and oriented to person, place, and time.  Skin: Skin is warm and dry. No rash noted. No erythema.  Psychiatric: a normal mood and affect.  behavior is normal.        Assessment & Plan:  hiv = well controlled, continue on stribild  htn = will need to start therapy, since consistently elevated. Will start hctz  Fatigue =  Will check tsh, cbc.   Weight gain = get dietician, weight loss program at [facility] to consider referral

## 2014-06-12 ENCOUNTER — Telehealth: Payer: Self-pay | Admitting: *Deleted

## 2014-06-12 NOTE — Telephone Encounter (Signed)
Patient called c/o abdominal pain and vaginal discharge. No available appointments this week. Patient advised to go to Urgent Care to be evaluated. She agreed to this. Wendall MolaJacqueline Cockerham

## 2014-07-07 ENCOUNTER — Other Ambulatory Visit: Payer: Self-pay | Admitting: Internal Medicine

## 2014-08-04 ENCOUNTER — Emergency Department (HOSPITAL_COMMUNITY)
Admission: EM | Admit: 2014-08-04 | Discharge: 2014-08-04 | Disposition: A | Discharge status: Home / Self Care | Payer: Medicaid Other | Attending: Emergency Medicine | Admitting: Emergency Medicine

## 2014-08-04 ENCOUNTER — Emergency Department (HOSPITAL_COMMUNITY): Payer: Medicaid Other

## 2014-08-04 ENCOUNTER — Encounter (HOSPITAL_COMMUNITY): Payer: Self-pay | Admitting: Emergency Medicine

## 2014-08-04 DIAGNOSIS — F17200 Nicotine dependence, unspecified, uncomplicated: Secondary | ICD-10-CM | POA: Insufficient documentation

## 2014-08-04 DIAGNOSIS — R1013 Epigastric pain: Secondary | ICD-10-CM | POA: Insufficient documentation

## 2014-08-04 DIAGNOSIS — Z21 Asymptomatic human immunodeficiency virus [HIV] infection status: Secondary | ICD-10-CM | POA: Insufficient documentation

## 2014-08-04 DIAGNOSIS — Z79899 Other long term (current) drug therapy: Secondary | ICD-10-CM | POA: Insufficient documentation

## 2014-08-04 DIAGNOSIS — K5900 Constipation, unspecified: Secondary | ICD-10-CM | POA: Diagnosis not present

## 2014-08-04 DIAGNOSIS — R1084 Generalized abdominal pain: Secondary | ICD-10-CM | POA: Insufficient documentation

## 2014-08-04 DIAGNOSIS — Z3202 Encounter for pregnancy test, result negative: Secondary | ICD-10-CM | POA: Insufficient documentation

## 2014-08-04 LAB — URINALYSIS, ROUTINE W REFLEX MICROSCOPIC
Glucose, UA: NEGATIVE mg/dL
Hgb urine dipstick: NEGATIVE
Ketones, ur: NEGATIVE mg/dL
Leukocytes, UA: NEGATIVE
Nitrite: NEGATIVE
Protein, ur: NEGATIVE mg/dL
Specific Gravity, Urine: 1.023 (ref 1.005–1.030)
Urobilinogen, UA: 1 mg/dL (ref 0.0–1.0)
pH: 8 (ref 5.0–8.0)

## 2014-08-04 LAB — COMPREHENSIVE METABOLIC PANEL
ALT: 14 U/L (ref 0–35)
AST: 24 U/L (ref 0–37)
Albumin: 4.1 g/dL (ref 3.5–5.2)
Alkaline Phosphatase: 103 U/L (ref 39–117)
Anion gap: 17 — ABNORMAL HIGH (ref 5–15)
BUN: 12 mg/dL (ref 6–23)
CO2: 21 mEq/L (ref 19–32)
Calcium: 10.1 mg/dL (ref 8.4–10.5)
Chloride: 95 mEq/L — ABNORMAL LOW (ref 96–112)
Creatinine, Ser: 0.72 mg/dL (ref 0.50–1.10)
GFR calc Af Amer: >90 mL/min (ref >90)
GFR calc non Af Amer: >90 mL/min (ref >90)
Glucose, Bld: 101 mg/dL — ABNORMAL HIGH (ref 70–99)
Potassium: 3.8 mEq/L (ref 3.7–5.3)
Sodium: 133 mEq/L — ABNORMAL LOW (ref 137–147)
Total Bilirubin: 0.4 mg/dL (ref 0.3–1.2)
Total Protein: 8.7 g/dL — ABNORMAL HIGH (ref 6.0–8.3)

## 2014-08-04 LAB — CBC WITH DIFFERENTIAL/PLATELET
Basophils Absolute: 0 10*3/uL (ref 0.0–0.1)
Basophils Relative: 0 % (ref 0–1)
Eosinophils Absolute: 0 10*3/uL (ref 0.0–0.7)
Eosinophils Relative: 1 % (ref 0–5)
HCT: 37.4 % (ref 36.0–46.0)
Hemoglobin: 12.6 g/dL (ref 12.0–15.0)
Lymphocytes Relative: 27 % (ref 12–46)
Lymphs Abs: 1.9 10*3/uL (ref 0.7–4.0)
MCH: 27.3 pg (ref 26.0–34.0)
MCHC: 33.7 g/dL (ref 30.0–36.0)
MCV: 81 fL (ref 78.0–100.0)
Monocytes Absolute: 0.6 10*3/uL (ref 0.1–1.0)
Monocytes Relative: 9 % (ref 3–12)
Neutro Abs: 4.5 10*3/uL (ref 1.7–7.7)
Neutrophils Relative %: 63 % (ref 43–77)
Platelets: 262 10*3/uL (ref 150–400)
RBC: 4.62 MIL/uL (ref 3.87–5.11)
RDW: 14 % (ref 11.5–15.5)
WBC: 7 10*3/uL (ref 4.0–10.5)

## 2014-08-04 LAB — LIPASE, BLOOD: Lipase: 17 U/L (ref 11–59)

## 2014-08-04 LAB — PREGNANCY, URINE: Preg Test, Ur: NEGATIVE

## 2014-08-04 MED ORDER — ONDANSETRON HCL 4 MG/2ML IJ SOLN
4.0000 mg | Freq: Once | INTRAMUSCULAR | Status: AC
  Administered 2014-08-04: 4 mg via INTRAVENOUS
  Filled 2014-08-04: qty 2

## 2014-08-04 MED ORDER — HYDROMORPHONE HCL PF 1 MG/ML IJ SOLN
1.0000 mg | Freq: Once | INTRAMUSCULAR | Status: AC
  Administered 2014-08-04: 1 mg via INTRAVENOUS
  Filled 2014-08-04: qty 1

## 2014-08-04 MED ORDER — SODIUM CHLORIDE 0.9 % IV BOLUS (SEPSIS)
1000.0000 mL | Freq: Once | INTRAVENOUS | Status: AC
Start: 2014-08-04 — End: 2014-08-04
  Administered 2014-08-04: 1000 mL via INTRAVENOUS

## 2014-08-04 MED ORDER — IOHEXOL 300 MG/ML  SOLN
50.0000 mL | Freq: Once | INTRAMUSCULAR | Status: AC | PRN
  Administered 2014-08-04: 50 mL via ORAL

## 2014-08-04 MED ORDER — IOHEXOL 300 MG/ML  SOLN
100.0000 mL | Freq: Once | INTRAMUSCULAR | Status: AC | PRN
  Administered 2014-08-04: 100 mL via INTRAVENOUS

## 2014-08-04 MED ORDER — DOCUSATE SODIUM 100 MG PO CAPS: 100.0000 mg | ORAL_CAPSULE | Freq: Two times a day (BID) | ORAL | Status: DC

## 2014-08-04 MED ORDER — TRAMADOL HCL 50 MG PO TABS: 50.0000 mg | ORAL_TABLET | Freq: Four times a day (QID) | ORAL | Status: DC | PRN

## 2014-08-04 NOTE — Progress Notes (Signed)
  CARE MANAGEMENT ED NOTE 08/04/2014  Patient:  Sarah Watkins, Sarah Watkins   Account Number:  [id number]  Date Initiated:  08/04/2014  Documentation initiated by:  Edd Arbour  Subjective/Objective Assessment:   33 yr old medicaid of [place] pt c/o abdominal cramps that started yesterday and was worse this am. No vomiting or diarrhea     Subjective/Objective Assessment Detail:   No pcp as confirmed by pt  Voiced understanding on how to obtain a new pcp from medicaid for f/u care from [facility] ED evaluation     Action/Plan:   spoke with pt to confirm no pcp Discussed list of [facility] providers in which CM provide dt pt and encouraged her to review list call provider of choice to see if accepting new pt and calling DSS to have provider of choice   Action/Plan Detail:   entered in DSS data base prior to first appointment with provider   Anticipated DC Date:  08/04/2014     Status Recommendation to Physician:   Result of Recommendation:    Other ED Services  Consult Working Plan    DC Planning Services  Other  PCP issues  Outpatient Services - Pt will follow up    Choice offered to / List presented to:            Status of service:  Completed, signed off  ED Comments:   ED Comments Detail:

## 2014-08-04 NOTE — ED Notes (Addendum)
I get her a [place] sandwich and a glass of pop per her request with approval from our PA, [place].  She tells me she feels "a lot better".  Note:  Thayer Ohm also has me hang a 2nd liter of IVF at wide open at this time.

## 2014-08-04 NOTE — Discharge Instructions (Signed)
Return here as needed. Follow up with your doctor. Increase your fluid intake. Also buy miralax as well.

## 2014-08-04 NOTE — ED Notes (Signed)
Patient transported to CT 

## 2014-08-04 NOTE — ED Notes (Signed)
Per PA, Pt given ginger ale.

## 2014-08-04 NOTE — ED Notes (Signed)
Pt complains of abdominal cramps that started yesterday and was worse this am. No vomiting or diarrhea

## 2014-08-04 NOTE — ED Notes (Signed)
Urine sample in room.

## 2014-08-04 NOTE — ED Provider Notes (Signed)
CSN: [id number]     Arrival date & time 08/04/14  2130 History   First MD Initiated Contact with Patient 08/04/14 [id number]     Chief Complaint  Patient presents with  . Abdominal Pain     (Consider location/radiation/quality/duration/timing/severity/associated sxs/prior Treatment) HPI Ms. Vecchio is a 33 year old female with a history of HIV infection, currently well controlled on Stribild, presenting today with upper abdominal pain. She describes the pain as sharp and colicky, which started 3 days ago and has been increasing in intensity. Patient rates the pain as 7/10 right now and 10/10 at the peak of colic. Patient reports no relationship with food intake, and slightly improvement with sitting up or fetal position. Pain has been accompanied by nausea for the past day, which has improved with Zofram. Endorses constipation, with last bowel movement a week ago (reports normally daily bowel movements) and unresponsive to milk of magnesia. Also endorses headache, dizziness and "shakiness" during episodes of colic, tenesmus, and scant whitish vaginal discharge. Denies any fever, chills, malaise, vomiting, dysphagia,  flatulence or eructation, back or chest pain, SOB, dysuria, or hematuria. History of abdominal surgery includes c-section 5 years ago. LMP 2 weeks ago with IUD for birth control.  Past Medical History  Diagnosis Date  . HIV (human immunodeficiency virus infection)    Past Surgical History  Procedure Laterality Date  . Cesarean section     Family History  Problem Relation Age of Onset  . Hypertension Mother   . Diabetes Mother   . Diabetes Brother    History  Substance Use Topics  . Smoking status: Current Every Day Smoker -- 0.40 packs/day    Types: Cigarettes  . Smokeless tobacco: Never Used  . Alcohol Use: Yes     Comment: occ   OB History   Grav Para Term Preterm Abortions TAB SAB Ect Mult Living                 Review of Systems All review of systems are negative  except as noted in the HPI.    Allergies  Sulfa antibiotics  Home Medications   Prior to Admission medications   Medication Sig Start Date End Date Taking? Authorizing Provider  elvitegravir-cobicistat-emtricitabine-tenofovir (STRIBILD) 150-150-200-300 MG TABS tablet Take 1 tablet by mouth daily with breakfast.   Yes Historical Provider, MD  hydrochlorothiazide (HYDRODIURIL) 25 MG tablet Take 1 tablet (25 mg total) by mouth daily. 05/18/14  Yes Judyann Munson, MD  ibuprofen (ADVIL,MOTRIN) 200 MG tablet Take 400 mg by mouth every 6 (six) hours as needed for headache.   Yes Historical Provider, MD  levonorgestrel (MIRENA) 20 MCG/24HR IUD 1 each by Intrauterine route once.   Yes Historical Provider, MD  ondansetron (ZOFRAN) 4 MG tablet Take 1 tablet (4 mg total) by mouth every 6 (six) hours. 01/30/14  Yes Darlys Gales, MD   BP 125/70  Pulse 70  Temp(Src) 97.6 F (36.4 C) (Oral)  Resp 18  SpO2 100%  LMP 07/27/2014 Physical Exam  Nursing note and vitals reviewed. Constitutional: She is oriented to person, place, and time. She appears well-developed and well-nourished. She appears distressed.  Patient in considerable pain, has difficulty laying supine.  HENT:  Head: Normocephalic and atraumatic.  Eyes: Pupils are equal, round, and reactive to light. No scleral icterus.  Neck: Normal range of motion. Neck supple.  Cardiovascular: Normal rate, regular rhythm, normal heart sounds and normal pulses.  Exam reveals no gallop and no friction rub.   No murmur heard.  Pulmonary/Chest: Effort normal and breath sounds normal. She exhibits no tenderness.  Abdominal: Soft. She exhibits no distension, no pulsatile liver, no abdominal bruit, no ascites and no mass. Bowel sounds are decreased. There is tenderness in the epigastric area. There is rebound and guarding. There is no rigidity and no CVA tenderness.    Neurological: She is alert and oriented to person, place, and time.  Skin: Skin is warm and  dry. No rash noted.    ED Course  Procedures (including critical care time) Labs Review Labs Reviewed  COMPREHENSIVE METABOLIC PANEL - Abnormal; Notable for the following:    Sodium 133 (*)    Chloride 95 (*)    Glucose, Bld 101 (*)    Total Protein 8.7 (*)    Anion gap 17 (*)    All other components within normal limits  URINALYSIS, ROUTINE W REFLEX MICROSCOPIC - Abnormal; Notable for the following:    Color, Urine AMBER (*)    APPearance CLOUDY (*)    Bilirubin Urine SMALL (*)    All other components within normal limits  CBC WITH DIFFERENTIAL  LIPASE, BLOOD  PREGNANCY, URINE   Patient is advised of her test results, and all questions were answered.  Patient is advised this could be an evolving process and to return here for any worsening in her condition. Will treat the constipation as well. Told to return here as needed. Follow up with her PCP.   Carlyle Dolly, PA-C 08/04/14 1227  Carlyle Dolly, PA-C 08/04/14 1227

## 2014-08-04 NOTE — ED Provider Notes (Signed)
Medical screening examination/treatment/procedure(s) were performed by non-physician practitioner and as supervising physician I was immediately available for consultation/collaboration.    Linwood Dibbles, MD 08/04/14 [phone]

## 2014-08-10 ENCOUNTER — Other Ambulatory Visit: Payer: Medicaid Other

## 2014-08-10 DIAGNOSIS — B20 Human immunodeficiency virus [HIV] disease: Secondary | ICD-10-CM

## 2014-08-10 LAB — CBC WITH DIFFERENTIAL/PLATELET
Basophils Absolute: 0 10*3/uL (ref 0.0–0.1)
Basophils Relative: 0 % (ref 0–1)
EOS ABS: 0.1 10*3/uL (ref 0.0–0.7)
Eosinophils Relative: 1 % (ref 0–5)
HCT: 34.2 % — ABNORMAL LOW (ref 36.0–46.0)
Hemoglobin: 11.5 g/dL — ABNORMAL LOW (ref 12.0–15.0)
LYMPHS PCT: 33 % (ref 12–46)
Lymphs Abs: 2.4 10*3/uL (ref 0.7–4.0)
MCH: 27.6 pg (ref 26.0–34.0)
MCHC: 33.6 g/dL (ref 30.0–36.0)
MCV: 82.2 fL (ref 78.0–100.0)
Monocytes Absolute: 0.5 10*3/uL (ref 0.1–1.0)
Monocytes Relative: 7 % (ref 3–12)
NEUTROS PCT: 59 % (ref 43–77)
Neutro Abs: 4.2 10*3/uL (ref 1.7–7.7)
PLATELETS: 282 10*3/uL (ref 150–400)
RBC: 4.16 MIL/uL (ref 3.87–5.11)
RDW: 15.6 % — ABNORMAL HIGH (ref 11.5–15.5)
WBC: 7.2 10*3/uL (ref 4.0–10.5)

## 2014-08-10 LAB — COMPREHENSIVE METABOLIC PANEL
ALT: 8 U/L (ref 0–35)
AST: 14 U/L (ref 0–37)
Albumin: 4.2 g/dL (ref 3.5–5.2)
Alkaline Phosphatase: 78 U/L (ref 39–117)
BILIRUBIN TOTAL: 0.3 mg/dL (ref 0.2–1.2)
BUN: 8 mg/dL (ref 6–23)
CO2: 25 mEq/L (ref 19–32)
Calcium: 9.4 mg/dL (ref 8.4–10.5)
Chloride: 103 mEq/L (ref 96–112)
Creat: 0.79 mg/dL (ref 0.50–1.10)
Glucose, Bld: 110 mg/dL — ABNORMAL HIGH (ref 70–99)
Potassium: 3.5 mEq/L (ref 3.5–5.3)
SODIUM: 136 meq/L (ref 135–145)
TOTAL PROTEIN: 7.3 g/dL (ref 6.0–8.3)

## 2014-08-11 LAB — T-HELPER CELL (CD4) - (RCID CLINIC ONLY)
CD4 % Helper T Cell: 35 % (ref 33–55)
CD4 T CELL ABS: 760 /uL (ref 400–2700)

## 2014-08-11 LAB — HIV-1 RNA QUANT-NO REFLEX-BLD: HIV-1 RNA Quant, Log: <1.3 {Log} (ref <1.30)

## 2014-08-21 ENCOUNTER — Ambulatory Visit (INDEPENDENT_AMBULATORY_CARE_PROVIDER_SITE_OTHER): Payer: Medicaid Other | Admitting: Internal Medicine

## 2014-08-21 ENCOUNTER — Encounter: Payer: Self-pay | Admitting: Internal Medicine

## 2014-08-21 VITALS — BP 121/77 | HR 69 | Temp 98.4°F | Wt 235.0 lb

## 2014-08-21 DIAGNOSIS — Z23 Encounter for immunization: Secondary | ICD-10-CM | POA: Diagnosis present

## 2014-08-21 NOTE — Progress Notes (Signed)
Patient ID: Sarah Watkins, female   DOB: [birth year], 33 y.o.   MRN: [id number]        Patient ID: Sarah Watkins, female   DOB: February 25, 1981, 33 y.o.   MRN: [id number]  HPI  33yo F with HIV disease ,wellcontrolled, CD 4 count of 760/VL<20 doing well with adherence. No problems with her health other than having episdoic constipation. She went to the ED for evaluation. She states that mag citrate often helps keep regular BM habits.  Outpatient Encounter Prescriptions as of 08/21/2014  Medication Sig  . docusate sodium (COLACE) 100 MG capsule Take 1 capsule (100 mg total) by mouth every 12 (twelve) hours.  . elvitegravir-cobicistat-emtricitabine-tenofovir (STRIBILD) 150-150-200-300 MG TABS tablet Take 1 tablet by mouth daily with breakfast.  . hydrochlorothiazide (HYDRODIURIL) 25 MG tablet Take 1 tablet (25 mg total) by mouth daily.  Marland Kitchen ibuprofen (ADVIL,MOTRIN) 200 MG tablet Take 400 mg by mouth every 6 (six) hours as needed for headache.  . levonorgestrel (MIRENA) 20 MCG/24HR IUD 1 each by Intrauterine route once.  . ondansetron (ZOFRAN) 4 MG tablet Take 1 tablet (4 mg total) by mouth every 6 (six) hours.  . [DISCONTINUED] traMADol (ULTRAM) 50 MG tablet Take 1 tablet (50 mg total) by mouth every 6 (six) hours as needed.     Patient Active Problem List   Diagnosis Date Noted  . Influenza-like illness 11/09/2013  . HIV disease 10/05/2013  . Dyslipidemia 10/05/2013  . Morbid obesity 10/05/2013  . Cigarette smoker 10/05/2013  . Adjustment disorder with mixed anxiety and depressed mood 08/31/2013     Health Maintenance Due  Topic Date Due  . Pap Smear  08/19/1997  . Tetanus/tdap  08/19/1998  . Influenza Vaccine  07/01/2014     Review of Systems + constipation, but 10 point ros is otherwise negative Physical Exam   BP 121/77  Pulse 69  Temp(Src) 98.4 F (36.9 C) (Oral)  Wt 235 lb (106.595 kg)  LMP 08/21/2014 Physical Exam  Constitutional:  oriented to person, place, and  time. appears well-developed and well-nourished. No distress.  HENT:  Mouth/Throat: Oropharynx is clear and moist. No oropharyngeal exudate.  Cardiovascular: Normal rate, regular rhythm and normal heart sounds. Exam reveals no gallop and no friction rub.  No murmur heard.  Pulmonary/Chest: Effort normal and breath sounds normal. No respiratory distress.  has no wheezes.  Abdominal: Soft. Bowel sounds are normal.  exhibits no distension. There is no tenderness.  Lymphadenopathy: no cervical adenopathy.  Neurological: alert and oriented to person, place, and time.  Skin: Skin is warm and dry. No rash noted. No erythema.  Psychiatric: a normal mood and affect. His behavior is normal.   Lab Results  Component Value Date   CD4TCELL 35 08/10/2014   Lab Results  Component Value Date   CD4TABS 760 08/10/2014   CD4TABS 840 04/11/2014   CD4TABS 800 01/05/2014   Lab Results  Component Value Date   HIV1RNAQUANT <20 08/10/2014   Lab Results  Component Value Date   HEPBSAB REACTIVE* 03/31/2013   No results found for this basename: RPR    CBC Lab Results  Component Value Date   WBC 7.2 08/10/2014   RBC 4.16 08/10/2014   HGB 11.5* 08/10/2014   HCT 34.2* 08/10/2014   PLT 282 08/10/2014   MCV 82.2 08/10/2014   MCH 27.6 08/10/2014   MCHC 33.6 08/10/2014   RDW 15.6* 08/10/2014   LYMPHSABS 2.4 08/10/2014   MONOABS 0.5 08/10/2014   EOSABS 0.1 08/10/2014  BASOSABS 0.0 08/10/2014   BMET Lab Results  Component Value Date   NA 136 08/10/2014   K 3.5 08/10/2014   CL 103 08/10/2014   CO2 25 08/10/2014   GLUCOSE 110* 08/10/2014   BUN 8 08/10/2014   CREATININE 0.79 08/10/2014   CALCIUM 9.4 08/10/2014   GFRNONAA >90 08/04/2014   GFRAA >90 08/04/2014     Assessment and Plan   hiv = well controlled, continue with stribild  Constipation = can cuse colace  daily as well as miralax  Health maintenance = give flu shot today

## 2014-08-21 NOTE — Progress Notes (Signed)
Patient ID: Sarah Watkins, female   DOB: [birth year], 33 y.o.   MRN: [id number]       Patient ID: Sarah Watkins, female   DOB: 1981/05/22, 33 y.o.   MRN: [id number]  HPI 33yo F with HIV disease, CD 4 count 760/VL<20, stribild with HTN, started on HCTZ at last visit  Outpatient Encounter Prescriptions as of 08/21/2014  Medication Sig  . docusate sodium (COLACE) 100 MG capsule Take 1 capsule (100 mg total) by mouth every 12 (twelve) hours.  . elvitegravir-cobicistat-emtricitabine-tenofovir (STRIBILD) 150-150-200-300 MG TABS tablet Take 1 tablet by mouth daily with breakfast.  . hydrochlorothiazide (HYDRODIURIL) 25 MG tablet Take 1 tablet (25 mg total) by mouth daily.  Marland Kitchen ibuprofen (ADVIL,MOTRIN) 200 MG tablet Take 400 mg by mouth every 6 (six) hours as needed for headache.  . levonorgestrel (MIRENA) 20 MCG/24HR IUD 1 each by Intrauterine route once.  . ondansetron (ZOFRAN) 4 MG tablet Take 1 tablet (4 mg total) by mouth every 6 (six) hours.  . [DISCONTINUED] traMADol (ULTRAM) 50 MG tablet Take 1 tablet (50 mg total) by mouth every 6 (six) hours as needed.     Patient Active Problem List   Diagnosis Date Noted  . Influenza-like illness 11/09/2013  . HIV disease 10/05/2013  . Dyslipidemia 10/05/2013  . Morbid obesity 10/05/2013  . Cigarette smoker 10/05/2013  . Adjustment disorder with mixed anxiety and depressed mood 08/31/2013     Health Maintenance Due  Topic Date Due  . Pap Smear  08/19/1997  . Tetanus/tdap  08/19/1998  . Influenza Vaccine  07/01/2014     Review of Systems  Physical Exam   BP 121/77  Pulse 69  Temp(Src) 98.4 F (36.9 C) (Oral)  Wt 235 lb (106.595 kg)  LMP 08/21/2014  Lab Results  Component Value Date   CD4TCELL 35 08/10/2014   Lab Results  Component Value Date   CD4TABS 760 08/10/2014   CD4TABS 840 04/11/2014   CD4TABS 800 01/05/2014   Lab Results  Component Value Date   HIV1RNAQUANT <20 08/10/2014   Lab Results  Component Value Date   HEPBSAB REACTIVE* 03/31/2013   No results found for this basename: RPR    CBC Lab Results  Component Value Date   WBC 7.2 08/10/2014   RBC 4.16 08/10/2014   HGB 11.5* 08/10/2014   HCT 34.2* 08/10/2014   PLT 282 08/10/2014   MCV 82.2 08/10/2014   MCH 27.6 08/10/2014   MCHC 33.6 08/10/2014   RDW 15.6* 08/10/2014   LYMPHSABS 2.4 08/10/2014   MONOABS 0.5 08/10/2014   EOSABS 0.1 08/10/2014   BASOSABS 0.0 08/10/2014   BMET Lab Results  Component Value Date   NA 136 08/10/2014   K 3.5 08/10/2014   CL 103 08/10/2014   CO2 25 08/10/2014   GLUCOSE 110* 08/10/2014   BUN 8 08/10/2014   CREATININE 0.79 08/10/2014   CALCIUM 9.4 08/10/2014   GFRNONAA >90 08/04/2014   GFRAA >90 08/04/2014     Assessment and Plan   HIV =  HTN = well controlled HCTZ  Health maintenance = received flu shot  Constipation = use otc mag citrate, or miralax

## 2014-09-06 ENCOUNTER — Encounter: Payer: Self-pay | Admitting: *Deleted

## 2014-09-06 ENCOUNTER — Emergency Department (HOSPITAL_COMMUNITY): Payer: Medicaid Other

## 2014-09-06 ENCOUNTER — Encounter (HOSPITAL_COMMUNITY): Payer: Self-pay | Admitting: Emergency Medicine

## 2014-09-06 ENCOUNTER — Emergency Department (HOSPITAL_COMMUNITY)
Admission: EM | Admit: 2014-09-06 | Discharge: 2014-09-06 | Disposition: A | Discharge status: Home / Self Care | Payer: Medicaid Other | Attending: Emergency Medicine | Admitting: Emergency Medicine

## 2014-09-06 DIAGNOSIS — Z720 Tobacco use: Secondary | ICD-10-CM | POA: Diagnosis not present

## 2014-09-06 DIAGNOSIS — Z79899 Other long term (current) drug therapy: Secondary | ICD-10-CM | POA: Insufficient documentation

## 2014-09-06 DIAGNOSIS — Z21 Asymptomatic human immunodeficiency virus [HIV] infection status: Secondary | ICD-10-CM | POA: Insufficient documentation

## 2014-09-06 DIAGNOSIS — Z792 Long term (current) use of antibiotics: Secondary | ICD-10-CM | POA: Diagnosis not present

## 2014-09-06 DIAGNOSIS — B20 Human immunodeficiency virus [HIV] disease: Secondary | ICD-10-CM

## 2014-09-06 DIAGNOSIS — R05 Cough: Secondary | ICD-10-CM | POA: Diagnosis present

## 2014-09-06 DIAGNOSIS — J069 Acute upper respiratory infection, unspecified: Secondary | ICD-10-CM

## 2014-09-06 MED ORDER — ALBUTEROL SULFATE HFA 108 (90 BASE) MCG/ACT IN AERS
2.0000 | INHALATION_SPRAY | Freq: Once | RESPIRATORY_TRACT | Status: AC
  Administered 2014-09-06: 2 via RESPIRATORY_TRACT
  Filled 2014-09-06: qty 6.7

## 2014-09-06 MED ORDER — BENZONATATE 100 MG PO CAPS: 100.0000 mg | ORAL_CAPSULE | Freq: Three times a day (TID) | ORAL | Status: DC | PRN

## 2014-09-06 NOTE — Discharge Instructions (Signed)
Return to the emergency room with worsening of symptoms, new symptoms or with symptoms that are concerning, fever, You develop severe or persistent headache, ear pain, sinus pain, or chest pain. You develop wheezing, a prolonged cough, cough up blood or thick purulent sputum or a stiff neck.  Continue to use inhaler for chest tightness. mucinex and other OTC cold meds like nyquil and dayquil.  Drink lots of fluid with electrolytes in them.     Upper Respiratory Infection, Adult An upper respiratory infection (URI) is also sometimes known as the common cold. The upper respiratory tract includes the nose, sinuses, throat, trachea, and bronchi. Bronchi are the airways leading to the lungs. Most people improve within 1 week, but symptoms can last up to 2 weeks. A residual cough may last even longer.  CAUSES Many different viruses can infect the tissues lining the upper respiratory tract. The tissues become irritated and inflamed and often become very moist. Mucus production is also common. A cold is contagious. You can easily spread the virus to others by oral contact. This includes kissing, sharing a glass, coughing, or sneezing. Touching your mouth or nose and then touching a surface, which is then touched by another person, can also spread the virus. SYMPTOMS  Symptoms typically develop 1 to 3 days after you come in contact with a cold virus. Symptoms vary from person to person. They may include:  Runny nose.  Sneezing.  Nasal congestion.  Sinus irritation.  Sore throat.  Loss of voice (laryngitis).  Cough.  Fatigue.  Muscle aches.  Loss of appetite.  Headache.  Low-grade fever. DIAGNOSIS  You might diagnose your own cold based on familiar symptoms, since most people get a cold 2 to 3 times a year. Your caregiver can confirm this based on your exam. Most importantly, your caregiver can check that your symptoms are not due to another disease such as strep throat, sinusitis,  pneumonia, asthma, or epiglottitis. Blood tests, throat tests, and X-rays are not necessary to diagnose a common cold, but they may sometimes be helpful in excluding other more serious diseases. Your caregiver will decide if any further tests are required. RISKS AND COMPLICATIONS  You may be at risk for a more severe case of the common cold if you smoke cigarettes, have chronic heart disease (such as heart failure) or lung disease (such as asthma), or if you have a weakened immune system. The very young and very old are also at risk for more serious infections. Bacterial sinusitis, middle ear infections, and bacterial pneumonia can complicate the common cold. The common cold can worsen asthma and chronic obstructive pulmonary disease (COPD). Sometimes, these complications can require emergency medical care and may be life-threatening. PREVENTION  The best way to protect against getting a cold is to practice good hygiene. Avoid oral or hand contact with people with cold symptoms. Wash your hands often if contact occurs. There is no clear evidence that vitamin C, vitamin E, echinacea, or exercise reduces the chance of developing a cold. However, it is always recommended to get plenty of rest and practice good nutrition. TREATMENT  Treatment is directed at relieving symptoms. There is no cure. Antibiotics are not effective, because the infection is caused by a virus, not by bacteria. Treatment may include:  Increased fluid intake. Sports drinks offer valuable electrolytes, sugars, and fluids.  Breathing heated mist or steam (vaporizer or shower).  Eating chicken soup or other clear broths, and maintaining good nutrition.  Getting plenty of rest.  Using gargles or lozenges for comfort.  Controlling fevers with ibuprofen or acetaminophen as directed by your caregiver.  Increasing usage of your inhaler if you have asthma. Zinc gel and zinc lozenges, taken in the first 24 hours of the common cold, can  shorten the duration and lessen the severity of symptoms. Pain medicines may help with fever, muscle aches, and throat pain. A variety of non-prescription medicines are available to treat congestion and runny nose. Your caregiver can make recommendations and may suggest nasal or lung inhalers for other symptoms.  HOME CARE INSTRUCTIONS   Only take over-the-counter or prescription medicines for pain, discomfort, or fever as directed by your caregiver.  Use a warm mist humidifier or inhale steam from a shower to increase air moisture. This may keep secretions moist and make it easier to breathe.  Drink enough water and fluids to keep your urine clear or pale yellow.  Rest as needed.  Return to work when your temperature has returned to normal or as your caregiver advises. You may need to stay home longer to avoid infecting others. You can also use a face mask and careful hand washing to prevent spread of the virus. SEEK MEDICAL CARE IF:   After the first few days, you feel you are getting worse rather than better.  You need your caregiver's advice about medicines to control symptoms.  You develop chills, worsening shortness of breath, or brown or red sputum. These may be signs of pneumonia.  You develop yellow or brown nasal discharge or pain in the face, especially when you bend forward. These may be signs of sinusitis.  You develop a fever, swollen neck glands, pain with swallowing, or white areas in the back of your throat. These may be signs of strep throat. SEEK IMMEDIATE MEDICAL CARE IF:   You have a fever.  You develop severe or persistent headache, ear pain, sinus pain, or chest pain.  You develop wheezing, a prolonged cough, cough up blood, or have a change in your usual mucus (if you have chronic lung disease).  You develop sore muscles or a stiff neck. Document Released: 05/13/2001 Document Revised: 02/09/2012 Document Reviewed: 02/22/2014 [facility] Patient Information 2015  [place], [place]. This information is not intended to replace advice given to you by your health care provider. Make sure you discuss any questions you have with your health care provider.

## 2014-09-06 NOTE — ED Notes (Signed)
Per pt, with body aches for 2 days.  No fever.  Has cough and congestion.  Non productive.  Shortness of breath.

## 2014-09-06 NOTE — ED Provider Notes (Signed)
CSN: [id number]     Arrival date & time 09/06/14  1040 History  This chart was scribed for Oswaldo ConroyVictoria Maryn Freelove, [place] with Shon Batonourtney F Horton, MD by Tonye RoyaltyJoshua Chen, ED Scribe. This patient was seen in room WTR6/WTR6 and the patient's care was started at 12:21 PM.    Chief Complaint  Patient presents with  . Generalized Body Aches  . Cough   The history is provided by the patient and medical records. No language interpreter was used.    HPI Comments: Sarah Watkins is a 33 y.o. female with history of HIV who presents to the Emergency Department complaining of body aches and headache with onset 2 days ago. She states her CD4 count was in the 700s at her last visit to the doctor who follows her HIV. She states she is compliant with her antiviral medications except that she states she has not taken any today because she has not eaten and does not like to take them on an empty stomach. She reports that she will take them today. She reports associated non-productive cough, rhinorrhea, chest tightness, and hot/cold flashes. She reports minor shortness of breath after coughing. She states her headache is to her frontal temples and face, and characterizes it as like sinus headache. She also reports hot and cold flashes. She denies history of asthma but reports smoking every day. She denies fever, difficulty breathing, nausea, vomiting, diarrhea, or abdominal pain.  Records indicate that on 9/19, her CD4 was 760 and VL<20. She is taking Stribid with HTN as well as HCTZ.  Past Medical History  Diagnosis Date  . HIV (human immunodeficiency virus infection)    Past Surgical History  Procedure Laterality Date  . Cesarean section     Family History  Problem Relation Age of Onset  . Hypertension Mother   . Diabetes Mother   . Diabetes Brother    History  Substance Use Topics  . Smoking status: Current Every Day Smoker -- 0.40 packs/day    Types: Cigarettes  . Smokeless tobacco: Never Used  . Alcohol Use:  Yes     Comment: occ   OB History   Grav Para Term Preterm Abortions TAB SAB Ect Mult Living                 Review of Systems  Constitutional: Positive for chills. Negative for fever.       Body aches  HENT: Positive for rhinorrhea.   Respiratory: Positive for cough, chest tightness and shortness of breath.   Gastrointestinal: Negative for nausea, vomiting, diarrhea and abdominal distention.  Neurological: Positive for headaches.      Allergies  Sulfa antibiotics  Home Medications   Prior to Admission medications   Medication Sig Start Date End Date Taking? Authorizing Provider  elvitegravir-cobicistat-emtricitabine-tenofovir (STRIBILD) 150-150-200-300 MG TABS tablet Take 1 tablet by mouth daily with breakfast.   Yes Historical Provider, MD  hydrochlorothiazide (HYDRODIURIL) 25 MG tablet Take 1 tablet (25 mg total) by mouth daily. 05/18/14  Yes Judyann Munsonynthia Snider, MD  ibuprofen (ADVIL,MOTRIN) 200 MG tablet Take 400 mg by mouth every 6 (six) hours as needed for headache.   Yes Historical Provider, MD  metroNIDAZOLE (FLAGYL) 500 MG tablet Take 500 mg by mouth 2 (two) times daily.   Yes Historical Provider, MD  benzonatate (TESSALON PERLES) 100 MG capsule Take 1 capsule (100 mg total) by mouth 3 (three) times daily as needed for cough. 09/06/14   Louann SjogrenVictoria L Letica Giaimo, PA-C  levonorgestrel (MIRENA) 20 MCG/24HR IUD  1 each by Intrauterine route once.    Historical Provider, MD   BP 99/76  Pulse 100  Temp(Src) 99.6 F (37.6 C) (Oral)  Resp 18  SpO2 100%  LMP 08/21/2014 Physical Exam  Nursing note and vitals reviewed. Constitutional: She appears well-developed and well-nourished. No distress.  HENT:  Head: Normocephalic and atraumatic.  Frontal sinus tenderness bilaterally, pharynx erythema no exudates or edema, cobblestoning of the oropharynx  Eyes: Conjunctivae are normal. Right eye exhibits no discharge. Left eye exhibits no discharge.  Cardiovascular: Normal rate, regular rhythm  and normal heart sounds.   No murmur heard. Pulmonary/Chest: Effort normal and breath sounds normal. No respiratory distress. She has no wheezes. She has no rales.  Abdominal: There is no tenderness.  Lymphadenopathy:    She has no cervical adenopathy.  Neurological: She is alert. Coordination normal.  Skin: She is not diaphoretic.  Psychiatric: She has a normal mood and affect. Her behavior is normal.    ED Course  Procedures (including critical care time) Labs Review Labs Reviewed - No data to display  Imaging Review Dg Chest 2 View  09/06/2014   CLINICAL DATA:  Chest congestion. Cough. Shortness of breath. Dizziness. Body aches. Tobacco use. HIV.  EXAM: CHEST  2 VIEW  COMPARISON:  11/09/2013  FINDINGS: The heart size and mediastinal contours are within normal limits. Both lungs are clear. The visualized skeletal structures are unremarkable.  IMPRESSION: No active cardiopulmonary disease.   Electronically Signed   By: Herbie Baltimore M.D.   On: 09/06/2014 12:39     EKG Interpretation None     DIAGNOSTIC STUDIES: Oxygen Saturation is 97% on room air, normal by my interpretation.    COORDINATION OF CARE: 12:28 PM Will obtain chest x-ray and check CD4 count and plan to discharge if they are benign. Will prescribe Albuterol inhaler and chest tightness and shortness of breath as well as a cough suppressant. I counseled her to stop smoking. Patient agrees with the plan and has no further questions at this time.  Meds given in ED:  Medications  albuterol (PROVENTIL HFA;VENTOLIN HFA) 108 (90 BASE) MCG/ACT inhaler 2 puff (2 puffs Inhalation Given 09/06/14 1240)    Discharge Medication List as of 09/06/2014  1:09 PM    START taking these medications   Details  benzonatate (TESSALON PERLES) 100 MG capsule Take 1 capsule (100 mg total) by mouth 3 (three) times daily as needed for cough., Starting 09/06/2014, Until Discontinued, Print          MDM   Final diagnoses:  URI (upper  respiratory infection)  HIV disease   Pt with HIV and CD4 count 760 and reports compliance with medications. Pt CXR negative for acute infiltrate. Patients symptoms are consistent with URI, likely viral etiology. Discussed that antibiotics are not indicated for viral infections. Pt will be discharged with symptomatic treatment.  Verbalizes understanding and is agreeable with plan. Pt is hemodynamically stable & in NAD prior to dc.  Discussed return precautions with patient. Discussed all results and patient verbalizes understanding and agrees with plan.  Case has been discussed with Dr. Wilkie Aye who agrees with the above plan and to discharge.   I personally performed the services described in this documentation, which was scribed in my presence. The recorded information has been reviewed and is accurate.    Louann Sjogren, PA-C 09/06/14 1331

## 2014-09-07 NOTE — ED Provider Notes (Signed)
Medical screening examination/treatment/procedure(s) were performed by non-physician practitioner and as supervising physician I was immediately available for consultation/collaboration.   EKG Interpretation None        Shon Batonourtney F Romy Mcgue, MD 09/07/14 [phone]

## 2014-09-13 NOTE — Progress Notes (Signed)
Patient ID: Sarah GilbertDemetria S Watkins, female   DOB: 1981-08-15, 33 y.o.   MRN: [id number] [facility] [place].

## 2014-11-20 ENCOUNTER — Other Ambulatory Visit: Payer: Self-pay

## 2014-12-04 ENCOUNTER — Ambulatory Visit: Payer: Self-pay | Admitting: Internal Medicine

## 2016-07-28 DIAGNOSIS — Z789 Other specified health status: Secondary | ICD-10-CM

## 2016-07-28 HISTORY — DX: Other specified health status: Z78.9

## 2017-04-29 ENCOUNTER — Other Ambulatory Visit (HOSPITAL_COMMUNITY)
Admission: RE | Admit: 2017-04-29 | Discharge: 2017-04-29 | Disposition: A | Discharge status: Home / Self Care | Payer: Medicaid Other | Source: Ambulatory Visit | Attending: Infectious Diseases | Admitting: Infectious Diseases

## 2017-04-29 ENCOUNTER — Ambulatory Visit (INDEPENDENT_AMBULATORY_CARE_PROVIDER_SITE_OTHER): Payer: Self-pay | Admitting: Infectious Diseases

## 2017-04-29 ENCOUNTER — Encounter: Payer: Self-pay | Admitting: Infectious Diseases

## 2017-04-29 VITALS — BP 112/79 | HR 62 | Temp 98.3°F | Ht 64.0 in | Wt 231.0 lb

## 2017-04-29 DIAGNOSIS — I10 Essential (primary) hypertension: Secondary | ICD-10-CM

## 2017-04-29 DIAGNOSIS — Z113 Encounter for screening for infections with a predominantly sexual mode of transmission: Secondary | ICD-10-CM

## 2017-04-29 DIAGNOSIS — Z0000 Encounter for general adult medical examination without abnormal findings: Secondary | ICD-10-CM

## 2017-04-29 DIAGNOSIS — Z79899 Other long term (current) drug therapy: Secondary | ICD-10-CM

## 2017-04-29 DIAGNOSIS — Z30431 Encounter for routine checking of intrauterine contraceptive device: Secondary | ICD-10-CM

## 2017-04-29 DIAGNOSIS — F17210 Nicotine dependence, cigarettes, uncomplicated: Secondary | ICD-10-CM

## 2017-04-29 DIAGNOSIS — B20 Human immunodeficiency virus [HIV] disease: Secondary | ICD-10-CM

## 2017-04-29 LAB — CBC
HCT: 38.3 % (ref 35.0–45.0)
Hemoglobin: 12.6 g/dL (ref 11.7–15.5)
MCH: 28.1 pg (ref 27.0–33.0)
MCHC: 32.9 g/dL (ref 32.0–36.0)
MCV: 85.3 fL (ref 80.0–100.0)
MPV: 10.1 fL (ref 7.5–12.5)
PLATELETS: 297 10*3/uL (ref 140–400)
RBC: 4.49 MIL/uL (ref 3.80–5.10)
RDW: 14.5 % (ref 11.0–15.0)
WBC: 6.7 10*3/uL (ref 3.8–10.8)

## 2017-04-29 LAB — TSH: TSH: 1.72 m[IU]/L

## 2017-04-29 LAB — COMPREHENSIVE METABOLIC PANEL
ALT: 9 U/L (ref 6–29)
AST: 15 U/L (ref 10–30)
Albumin: 4.2 g/dL (ref 3.6–5.1)
Alkaline Phosphatase: 71 U/L (ref 33–115)
BUN: 14 mg/dL (ref 7–25)
CHLORIDE: 104 mmol/L (ref 98–110)
CO2: 28 mmol/L (ref 20–31)
CREATININE: 0.82 mg/dL (ref 0.50–1.10)
Calcium: 9.7 mg/dL (ref 8.6–10.2)
GLUCOSE: 81 mg/dL (ref 65–99)
Potassium: 3.6 mmol/L (ref 3.5–5.3)
SODIUM: 138 mmol/L (ref 135–146)
Total Bilirubin: 0.4 mg/dL (ref 0.2–1.2)
Total Protein: 7.5 g/dL (ref 6.1–8.1)

## 2017-04-29 LAB — LIPID PANEL
CHOLESTEROL: 234 mg/dL — AB (ref <200)
HDL: 40 mg/dL — ABNORMAL LOW (ref >50)
LDL Cholesterol: 168 mg/dL — ABNORMAL HIGH (ref <100)
Total CHOL/HDL Ratio: 5.9 Ratio — ABNORMAL HIGH (ref <5.0)
Triglycerides: 129 mg/dL (ref <150)
VLDL: 26 mg/dL (ref <30)

## 2017-04-29 MED ORDER — ONDANSETRON HCL 4 MG PO TABS: 4.0000 mg | ORAL_TABLET | Freq: Three times a day (TID) | ORAL | 3 refills | Status: DC | PRN

## 2017-04-29 MED ORDER — ELVITEG-COBIC-EMTRICIT-TENOFAF 150-150-200-10 MG PO TABS: 1.0000 | ORAL_TABLET | Freq: Every day | ORAL | 3 refills | Status: DC

## 2017-04-29 NOTE — Progress Notes (Signed)
   Subjective:    Patient ID: Sarah Watkins, female    DOB: May 05, 1981, 36 y.o.   MRN: [id number]  HPI 36 yo F with hx of HIV+ since 01-2013. Was prev on stribild. Moved to [place] 2015 and is now returning to [place].  No hospitalizations.  No problems. Has nausea she associates with her ART. Takes zofran from her mom's supply. This in turn gives her constipation.   Wants to get GYN f/u, needs PAP and has IUD.    HIV 1 RNA Quant (copies/mL)  Date Value  08/10/2014 <20  04/11/2014 <20  01/05/2014 <20   CD4 T Cell Abs (/uL)  Date Value  08/10/2014 760  04/11/2014 840  01/05/2014 800   The past medical history, family history and social history were reviewed/updated in EPIC   Review of Systems  Constitutional: Negative for appetite change and unexpected weight change.  Respiratory: Negative for cough and shortness of breath.   Cardiovascular: Positive for leg swelling. Negative for chest pain.  Gastrointestinal: Positive for constipation. Negative for diarrhea.  Genitourinary: Positive for frequency. Negative for difficulty urinating and menstrual problem.  Neurological: Positive for headaches.  takes advil for headaches, relates to moving back to [place].  Wants to loose wt- does not watch diet. Does not exercise. Has been having elbow pain, ache. Attributes to prev strain. Worse with rising/stiff. Has not taken advil for this.  348 yo son has good health.   Works as CNA    Objective:   Physical Exam  Constitutional: She appears well-developed and well-nourished.  HENT:  Mouth/Throat: No oropharyngeal exudate.  Eyes: EOM are normal. Pupils are equal, round, and reactive to light.  Neck: Neck supple.  Cardiovascular: Normal rate, regular rhythm and normal heart sounds.   Pulmonary/Chest: Effort normal and breath sounds normal.  Abdominal: Soft. Bowel sounds are normal. There is no tenderness. There is no rebound.  Musculoskeletal: She exhibits no edema.  Lymphadenopathy:    She  has no cervical adenopathy.  Psychiatric: She has a normal mood and affect.      Assessment & Plan:

## 2017-04-29 NOTE — Assessment & Plan Note (Signed)
Encouraged to quit. 

## 2017-04-29 NOTE — Assessment & Plan Note (Signed)
She is doing well Given condoms Change to genvoya hopefully will improve nausea.  Mening vax Would like eye eval when her insurance is complete Will see back in 3 months

## 2017-04-29 NOTE — Assessment & Plan Note (Signed)
Will continue HCTZ for now Seems more likely she was given for edema.  Advised wt loss, compression stockings.  Consider stop at next visit.

## 2017-04-29 NOTE — Assessment & Plan Note (Signed)
Encouraged to exercise, watch diet.  Compression stockings.

## 2017-04-30 ENCOUNTER — Encounter: Payer: Self-pay | Admitting: Infectious Diseases

## 2017-04-30 LAB — HEMOGLOBIN A1C
Hgb A1c MFr Bld: 5.3 % (ref <5.7)
Mean Plasma Glucose: 105 mg/dL

## 2017-04-30 LAB — T-HELPER CELL (CD4) - (RCID CLINIC ONLY)
CD4 % Helper T Cell: 38 % (ref 33–55)
CD4 T CELL ABS: 1010 /uL (ref 400–2700)

## 2017-04-30 LAB — URINE CYTOLOGY ANCILLARY ONLY
CHLAMYDIA, DNA PROBE: NEGATIVE
NEISSERIA GONORRHEA: NEGATIVE

## 2017-04-30 LAB — QUANTIFERON TB GOLD ASSAY (BLOOD)
INTERFERON GAMMA RELEASE ASSAY: NEGATIVE
Mitogen-Nil: >10 IU/mL
QUANTIFERON NIL VALUE: 0.03 [IU]/mL
QUANTIFERON TB AG MINUS NIL: 0 [IU]/mL

## 2017-04-30 LAB — RPR

## 2017-04-30 LAB — HEPATITIS B SURFACE ANTIGEN: HEP B S AG: NEGATIVE

## 2017-04-30 LAB — HEPATITIS B SURFACE ANTIBODY,QUALITATIVE: Hep B S Ab: POSITIVE — AB

## 2017-04-30 LAB — HEPATITIS C ANTIBODY: HCV Ab: NEGATIVE

## 2017-04-30 NOTE — Addendum Note (Signed)
Addended by: Andree CossHOWELL, Akasia Ahmad M on: 04/30/2017 09:16 AM   Modules accepted: Orders

## 2017-04-30 NOTE — Progress Notes (Signed)
Patient will be losing her insurance 6/2. She will come back to meet with Olegario MessierKathy and apply for RW/ADAP. She will get her Menveo immunization at that time. Andree CossHowell, [place]an Samford M, RN

## 2017-05-01 LAB — HIV-1 RNA QUANT-NO REFLEX-BLD
HIV 1 RNA QUANT: DETECTED {copies}/mL — AB
HIV-1 RNA Quant, Log: <1.3 Log copies/mL — AB

## 2017-05-04 LAB — HLA B*5701: HLA-B 5701 W/RFLX HLA-B HIGH: NEGATIVE

## 2017-05-18 ENCOUNTER — Encounter: Payer: Self-pay | Admitting: Obstetrics & Gynecology

## 2017-06-18 ENCOUNTER — Other Ambulatory Visit: Payer: Self-pay | Admitting: *Deleted

## 2017-06-18 DIAGNOSIS — I10 Essential (primary) hypertension: Secondary | ICD-10-CM

## 2017-06-18 MED ORDER — HYDROCHLOROTHIAZIDE 25 MG PO TABS: 25.0000 mg | ORAL_TABLET | Freq: Every day | ORAL | 11 refills | Status: DC

## 2017-06-24 ENCOUNTER — Encounter: Payer: Self-pay | Admitting: Obstetrics & Gynecology

## 2017-07-22 ENCOUNTER — Encounter: Payer: Self-pay | Admitting: General Practice

## 2017-07-22 ENCOUNTER — Other Ambulatory Visit: Payer: Self-pay

## 2017-07-22 ENCOUNTER — Encounter: Payer: Self-pay | Admitting: Obstetrics & Gynecology

## 2017-08-05 ENCOUNTER — Encounter: Payer: Self-pay | Admitting: Infectious Diseases

## 2017-08-05 ENCOUNTER — Ambulatory Visit: Payer: Self-pay | Admitting: Infectious Diseases

## 2017-10-14 ENCOUNTER — Other Ambulatory Visit: Payer: Self-pay

## 2017-10-27 ENCOUNTER — Ambulatory Visit: Payer: Self-pay | Admitting: Infectious Diseases

## 2017-11-25 ENCOUNTER — Other Ambulatory Visit: Payer: Self-pay

## 2017-12-07 ENCOUNTER — Encounter: Payer: Self-pay | Admitting: Infectious Diseases

## 2017-12-07 ENCOUNTER — Ambulatory Visit (INDEPENDENT_AMBULATORY_CARE_PROVIDER_SITE_OTHER): Payer: Self-pay | Admitting: Infectious Diseases

## 2017-12-07 ENCOUNTER — Ambulatory Visit (INDEPENDENT_AMBULATORY_CARE_PROVIDER_SITE_OTHER): Payer: No Typology Code available for payment source | Admitting: Licensed Clinical Social Worker

## 2017-12-07 VITALS — BP 135/84 | HR 80 | Temp 98.5°F | Ht 64.0 in | Wt 231.0 lb

## 2017-12-07 DIAGNOSIS — B20 Human immunodeficiency virus [HIV] disease: Secondary | ICD-10-CM

## 2017-12-07 DIAGNOSIS — Z113 Encounter for screening for infections with a predominantly sexual mode of transmission: Secondary | ICD-10-CM

## 2017-12-07 DIAGNOSIS — I10 Essential (primary) hypertension: Secondary | ICD-10-CM

## 2017-12-07 DIAGNOSIS — Z79899 Other long term (current) drug therapy: Secondary | ICD-10-CM

## 2017-12-07 DIAGNOSIS — F4321 Adjustment disorder with depressed mood: Secondary | ICD-10-CM

## 2017-12-07 DIAGNOSIS — F4323 Adjustment disorder with mixed anxiety and depressed mood: Secondary | ICD-10-CM

## 2017-12-07 NOTE — Assessment & Plan Note (Addendum)
We spoke at length about coping mechanisms. Sherrie is going to meet with her.

## 2017-12-07 NOTE — Assessment & Plan Note (Signed)
Given condoms Given flu shot.  Will check labs today.  She will check on portal.  Will see her back in 6 months.

## 2017-12-07 NOTE — BH Specialist Note (Signed)
Integrated Behavioral Health Initial Visit  MRN: [id number] Name: Sarah GilbertDemetria S Sopp  Number of Integrated Behavioral Health Clinician visits:: 1/6 Session Start time: 9:13 am  Session End time: 9:30am Total time: 17 mins  Type of Service: Integrated Behavioral Health- Individual/Family Interpretor:No. Interpretor Name and Language: N/A   Warm Hand Off Completed.       SUBJECTIVE: Sarah Watkins is a 37 y.o. female accompanied by self Patient was referred by Dr. Ninetta LightsHatcher for depressive symptoms.  Patient reports the following symptoms/concerns: Patient was diagnosed with HIV 5 years ago and reported difficulty adjusting to her diagnosis.  Symptoms include: "mad and angry" mood, anhedonia, and social isolation.  Patient reported that symptoms did not begin until she was diagnosed.  Patient stated that she was once excited about her field and going to school for nursing, but since her diagnosis does not feel excited about the field or her future anymore. Patient stated that she closes herself off from others and feels likes she does not have friends since she has isolated herself.  Patient also stated that her symptoms keep her from interacting with her son in the manner that she desires.  Patient denied current suicidal ideations, plan or intent but admitted to having [facility]  Interventions:  Educated and encouraged patient attendance at community support groups for normalization of experience and for social support.  Reviewed options for treatment.  Duration of problem: 5 years ago; Severity of problem: moderate  OBJECTIVE: Behavior: Tearful Mood: Depressed and Affect: Appropriate Risk of harm to self or others: No plan to harm self or others  LIFE CONTEXT: Family and Social: Patient lives with her mother [place]  Patient has one son. 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 Self-Care: Patient is able to tend to her ADL's and reports medication compliance. Life Changes: Patient was diagnosed with HIV 5 years ago and still has not adjusted to diagnosis.  ASSESSMENT: Patient is currently experiencing depressive symptoms and may benefit from behavioral health services and medication management.  GOALS ADDRESSED: Patient will: 1. Reduce symptoms of: depression 2. Increase knowledge and/or ability of: coping skills and self-management skills  3. Demonstrate ability to: Increase healthy adjustment to current life circumstances and Increase adequate support systems for patient/family  INTERVENTIONS: Interventions utilized: Supportive Counseling and Psychoeducation and/or Health Education   PLAN: 1. Follow up with behavioral health clinician on : Thursday Jan 10th at 1:30pm 2. Behavioral recommendations: Begin to consider the benefits of attending a HIV support group.   Vergia AlbertsSherry Marlon Vonruden, [facility]

## 2017-12-07 NOTE — Assessment & Plan Note (Signed)
Encouraged her to eat healthy. Exercise.  Discussed her Glc with her.

## 2017-12-07 NOTE — Assessment & Plan Note (Signed)
Well controlled on hctz

## 2017-12-07 NOTE — Progress Notes (Signed)
   Subjective:    Patient ID: Sarah Watkins, female    DOB: December 11, 1980, 37 y.o.   MRN: [id number]  HPI 37 yo F with HIV+ since 2014. Has been on genvoya. In good spirits. In school. Has had some fatigue, depression, missed work, missed school, appointments. Denies SI. Worries about being socially isolated.  Prev had IUD.  Has been active with ex-husband, also positive. Recent trich, BV.  Has 399 yo son.  Still occas nausea.   HIV 1 RNA Quant (copies/mL)  Date Value  04/29/2017 <20 DETECTED (A)  08/10/2014 <20  04/11/2014 <20   CD4 T Cell Abs (/uL)  Date Value  04/29/2017 1,010  08/10/2014 760  04/11/2014 840    Review of Systems  Constitutional: Positive for fatigue. Negative for appetite change and unexpected weight change.  Gastrointestinal: Positive for nausea. Negative for constipation and diarrhea.  Genitourinary: Negative for difficulty urinating and menstrual problem.  Psychiatric/Behavioral: Positive for dysphoric mood. Negative for sleep disturbance and suicidal ideas.  eating "the wrong things".  Has occas episodes of numbness in her RUE. Not always assoc with laying on that side. occas with holding something . Please see HPI. All other systems reviewed and negative.      Objective:   Physical Exam  Constitutional: She appears well-developed and well-nourished.  HENT:  Mouth/Throat: No oropharyngeal exudate.  Eyes: EOM are normal. Pupils are equal, round, and reactive to light.  Neck: Normal range of motion. Neck supple.  Cardiovascular: Normal rate, regular rhythm and normal heart sounds.  Pulmonary/Chest: Effort normal and breath sounds normal.  Abdominal: Soft. Bowel sounds are normal. There is no tenderness. There is no rebound.  Musculoskeletal: She exhibits no edema.  Lymphadenopathy:    She has no cervical adenopathy.  Psychiatric: She has a normal mood and affect.  Becomes tearful during exam.           Assessment & Plan:

## 2017-12-07 NOTE — Addendum Note (Signed)
Addended by: Mariea ClontsGREEN, Sherard Sutch D on: 12/07/2017 10:22 AM   Modules accepted: Orders

## 2017-12-08 LAB — RPR: RPR: NONREACTIVE

## 2017-12-08 LAB — LIPID PANEL
Cholesterol: 243 mg/dL — ABNORMAL HIGH (ref <200)
HDL: 36 mg/dL — ABNORMAL LOW (ref >50)
LDL Cholesterol (Calc): 183 mg/dL (calc) — ABNORMAL HIGH
NON-HDL CHOLESTEROL (CALC): 207 mg/dL — AB (ref <130)
Total CHOL/HDL Ratio: 6.8 (calc) — ABNORMAL HIGH (ref <5.0)
Triglycerides: 112 mg/dL (ref <150)

## 2017-12-08 LAB — T-HELPER CELL (CD4) - (RCID CLINIC ONLY)
CD4 T CELL HELPER: 34 % (ref 33–55)
CD4 T Cell Abs: 770 /uL (ref 400–2700)

## 2017-12-08 LAB — URINALYSIS, ROUTINE W REFLEX MICROSCOPIC
BILIRUBIN URINE: NEGATIVE
Glucose, UA: NEGATIVE
Hgb urine dipstick: NEGATIVE
Hyaline Cast: NONE SEEN /LPF
KETONES UR: NEGATIVE
NITRITE: NEGATIVE
PH: 7 (ref 5.0–8.0)
Protein, ur: NEGATIVE
SPECIFIC GRAVITY, URINE: 1.023 (ref 1.001–1.03)

## 2017-12-08 LAB — COMPREHENSIVE METABOLIC PANEL
AG RATIO: 1.4 (calc) (ref 1.0–2.5)
ALT: 8 U/L (ref 6–29)
AST: 12 U/L (ref 10–30)
Albumin: 4.5 g/dL (ref 3.6–5.1)
Alkaline phosphatase (APISO): 69 U/L (ref 33–115)
BILIRUBIN TOTAL: 0.4 mg/dL (ref 0.2–1.2)
BUN: 14 mg/dL (ref 7–25)
CHLORIDE: 103 mmol/L (ref 98–110)
CO2: 26 mmol/L (ref 20–32)
Calcium: 10 mg/dL (ref 8.6–10.2)
Creat: 0.77 mg/dL (ref 0.50–1.10)
GLOBULIN: 3.3 g/dL (ref 1.9–3.7)
GLUCOSE: 88 mg/dL (ref 65–99)
Potassium: 3.5 mmol/L (ref 3.5–5.3)
Sodium: 138 mmol/L (ref 135–146)
TOTAL PROTEIN: 7.8 g/dL (ref 6.1–8.1)

## 2017-12-08 LAB — CBC
HCT: 37.3 % (ref 35.0–45.0)
HEMOGLOBIN: 12.6 g/dL (ref 11.7–15.5)
MCH: 28.8 pg (ref 27.0–33.0)
MCHC: 33.8 g/dL (ref 32.0–36.0)
MCV: 85.4 fL (ref 80.0–100.0)
MPV: 10.8 fL (ref 7.5–12.5)
Platelets: 289 10*3/uL (ref 140–400)
RBC: 4.37 10*6/uL (ref 3.80–5.10)
RDW: 13.5 % (ref 11.0–15.0)
WBC: 7.5 10*3/uL (ref 3.8–10.8)

## 2017-12-08 LAB — URINE CYTOLOGY ANCILLARY ONLY
CHLAMYDIA, DNA PROBE: NEGATIVE
NEISSERIA GONORRHEA: NEGATIVE

## 2017-12-09 LAB — HIV-1 RNA QUANT-NO REFLEX-BLD
HIV 1 RNA Quant: <20 copies/mL
HIV-1 RNA Quant, Log: <1.3 Log copies/mL

## 2017-12-10 ENCOUNTER — Ambulatory Visit (INDEPENDENT_AMBULATORY_CARE_PROVIDER_SITE_OTHER): Payer: Self-pay | Admitting: Licensed Clinical Social Worker

## 2017-12-10 DIAGNOSIS — F4321 Adjustment disorder with depressed mood: Secondary | ICD-10-CM

## 2017-12-14 NOTE — BH Specialist Note (Signed)
Integrated Behavioral Health Follow Up Visit  MRN: [id number] Name: Sarah Watkins  Number of Integrated Behavioral Health Clinician visits: 2/6 Session Start time: 1:52 pm  Session End time: 2:50 pm Total time: 1 hour  Type of Service: Integrated Behavioral Health- Individual/Family Interpretor:No. Interpretor Name and Language: N/A  SUBJECTIVE: Sarah Watkins is a 37 y.o. female accompanied by self Patient was referred by Dr. Ninetta LightsHatcher for depressive symptoms.  Patient completed the PHQ-9 assessment and scored a 17, evidencing that she is moderately-severe depressed.  Patient verbalized internalized anger and repressed feelings of anger at her son's father for transmitting the HIV virus to her.  Patient verbalized her difficulties with viewing herself as worthy to love, having low self-esteem and self-worth.  Patient presented as insightful to her past actions of social isolation as punishing herself.  Patient also related social isolation to having a low energy level and to having low motivation and drive.  Patient verbalized her current thoughts and concerns with missing out on her son's life due to low energy.  [facility] facilitated patient looking to the future and things that she would like to accomplish.  Patient verbalized having a goal of becoming more active with her son.  [facility] also assisted the patient with identifying aspects of herself that she loves, to begin to show compassion and self-care.  Patient also verbalized having a history of struggling with fleeting suicidal ideations due to having the HIV virus.  Patient stated, "I feel like I would be better off without this".  Patient reported past suicide attempt by overdose where [facility], when she was first diagnosed with HIV.  Patient denied current plan or intention and reported that her son is a protective factor.  [facility] provided patient with a list of emergency numbers and instructed the patient  to report to the nearest ED if a plan and intention develops.  [facility] also asked patient to notify a family member if she develops a plan or intention.  Patient was receptive.  Duration of problem: 5 years; Severity of problem: moderate  OBJECTIVE: Behavior: Tearful Mood: Depressed and Affect: within range Risk of harm to self or others: Fleeting Suicidal ideation No plan to harm self or others  Thought Process: Circumstantial Thought Content: Logical  ASSESSMENT: Patient is currently experiencing depressive symptoms and may benefit from behavioral health services and medication management.  There has been little change with the patient's depression or decreased self-esteem.  GOALS ADDRESSED: Patient will: 1.  Reduce symptoms of: depression  2.  Increase knowledge and/or ability of: coping skills and self-management skills  3.  Demonstrate ability to: Increase healthy adjustment to current life circumstances and Increase adequate support systems for patient/family  INTERVENTIONS: Interventions utilized:  Solution-Focused Strategies and Supportive Counseling  PLAN: 1. Follow up with behavioral health clinician on : Tues Jan 15th at 1:30 pm 2. Behavioral recommendations: Begin to consider the benefits of attending a HIV support group.  Begin to work towards goal of planning ahead and scheduling activities with her son.  Report to the nearest ED or call Mobile Crisis services if develop plan and intention. 3. At next session will complete the GAD-7 to assess for level of anxiety.  Will continue to address increasing self-compassion and self-care, will assess for suicide ideation and plan, and will address negative thinking and depression.  Vergia AlbertsSherry Burlin Mcnair, [facility]

## 2017-12-15 ENCOUNTER — Telehealth: Payer: Self-pay | Admitting: Licensed Clinical Social Worker

## 2017-12-15 ENCOUNTER — Ambulatory Visit: Payer: Self-pay | Admitting: Licensed Clinical Social Worker

## 2017-12-15 NOTE — Telephone Encounter (Signed)
Patient called and stated that can not make appointment today and rescheduled for next week.  Vergia AlbertsSherry Greenleigh Kauth, [facility]

## 2017-12-21 ENCOUNTER — Ambulatory Visit (INDEPENDENT_AMBULATORY_CARE_PROVIDER_SITE_OTHER): Payer: Self-pay | Admitting: Licensed Clinical Social Worker

## 2017-12-21 DIAGNOSIS — F322 Major depressive disorder, single episode, severe without psychotic features: Secondary | ICD-10-CM

## 2017-12-22 ENCOUNTER — Other Ambulatory Visit: Payer: Self-pay | Admitting: Infectious Diseases

## 2017-12-22 DIAGNOSIS — F4323 Adjustment disorder with mixed anxiety and depressed mood: Secondary | ICD-10-CM

## 2017-12-22 MED ORDER — FLUOXETINE HCL 10 MG PO CAPS: 10.0000 mg | ORAL_CAPSULE | Freq: Every day | ORAL | 1 refills | Status: DC

## 2017-12-22 NOTE — BH Specialist Note (Signed)
Integrated Behavioral Health Follow Up Visit  MRN: [id number] Name: Kenna GilbertDemetria S Castelli  Number of Integrated Behavioral Health Clinician visits: 3/6 Session Start time: 4:05 pm  Session End time: 4:50 pm Total time: 45 minutes  Type of Service: Integrated Behavioral Health- Individual/Family Interpretor:No. Interpretor Name and Language: N/A  SUBJECTIVE: Kenna GilbertDemetria S Rutkowski is a 37 y.o. female accompanied by self Patient was referred by Dr. Ninetta LightsHatcher for depressive symptoms.  Patient came into the session and stated that she wants her mother to take care of her son because she feels unable to due to the depressive symptoms.  Patient reported increasing agitation and attitude that make communicating with her mother difficult.  Patient reported increase in seeking isolation and increase in desire to lay around the house that is making it hard to spend quality time and interact with her son.  Patient expressed that she wants to feel better but doesn't know.  Intervention:  [facility] passed out Handout on Dealing with Depression and processed with patient.  [facility] passed out and processed handout on the 17 Benefits of Exercise in Fighting Depression and explained how exercise creates natural feel good endorphins.  [facility] passed out and processed handout on the Positive Steps to Wellbeing and reviewed the role of self-compassion and acceptance in managing depression.  [facility] reviewed the benefits of antidepressant medications in decreasing depressive symptoms and increasing functionality and life satisfaction.    Patient was receptive to all information shared and stated she was willing to start antidepressant medication at this time to improve functionality.  [facility] stated that would inform Dr. Ninetta LightsHatcher of the severity and change in symptoms and consult with him about starting medications.  Decision was also made to increase weekly counseling sessions to twice weekly due to the severity of her symptoms.  Duration of  problem: 5 years; Severity of problem: severe  OBJECTIVE: Mood: Depressed and Irritable and Affect: Appropriate Risk of harm to self or others: No plan to harm self or others  Behavior: Tearful Thought Process: Coherent Thought Content: Logical  ASSESSMENT: Patient is currently experiencing severe depressive symptoms, with an increase in agitation, irritability and fatigue, and may benefit from behavioral health services and medication management.     GOALS ADDRESSED: Patient will: 1.  Reduce symptoms of: depression  2.  Increase knowledge and/or ability of: coping skills and self-management skills  3.  Demonstrate ability to: Increase healthy adjustment to current life circumstances and Increase adequate support systems for patient/family  INTERVENTIONS: Interventions utilized:  Solution-Focused Strategies and Supportive Counseling  Educated on the concept of Stinking Thinking and not trusting negative thinking, avoiding making major life decisions, avoiding drugs and alcohol, and not expecting symptoms to improve too soon, all while depressed.  PLAN: 1. Follow up with behavioral health clinician on : Thurs Jan 24th at 3:30 pm 2. Behavioral recommendations: Re-read all handouts and if approved by Dr. Ninetta LightsHatcher, begin taking antidepressant medication as soon as notified.  Report to the nearest ED or call Mobile Crisis if develop plan and intention. 3. At next session will complete the GAD-7 to assess for level of anxiety.  Will continue to assess for suicide ideations and plan, and will address negative thinking.  Vergia AlbertsSherry Tationna Fullard, [facility]

## 2017-12-22 NOTE — Progress Notes (Signed)
Discussed with counselor.  Pt depressed, rec anti-depressant.  Will rx prozac.  counselor will see her back BIW, report back on efficacy or if dose needs to be increased.

## 2017-12-24 ENCOUNTER — Ambulatory Visit: Payer: Self-pay | Admitting: Licensed Clinical Social Worker

## 2017-12-25 ENCOUNTER — Telehealth: Payer: Self-pay | Admitting: Licensed Clinical Social Worker

## 2017-12-25 NOTE — Telephone Encounter (Signed)
[facility] and left message requesting a return call.  Vergia AlbertsSherry Kawan [place], [facility]

## 2018-01-07 ENCOUNTER — Emergency Department (HOSPITAL_COMMUNITY): Payer: Self-pay

## 2018-01-07 ENCOUNTER — Other Ambulatory Visit: Payer: Self-pay

## 2018-01-07 ENCOUNTER — Emergency Department (HOSPITAL_COMMUNITY)
Admission: EM | Admit: 2018-01-07 | Discharge: 2018-01-07 | Disposition: A | Discharge status: Home / Self Care | Payer: Self-pay | Attending: Emergency Medicine | Admitting: Emergency Medicine

## 2018-01-07 ENCOUNTER — Encounter (HOSPITAL_COMMUNITY): Payer: Self-pay | Admitting: *Deleted

## 2018-01-07 DIAGNOSIS — H0520 Unspecified exophthalmos: Secondary | ICD-10-CM

## 2018-01-07 DIAGNOSIS — Z79899 Other long term (current) drug therapy: Secondary | ICD-10-CM | POA: Insufficient documentation

## 2018-01-07 DIAGNOSIS — I10 Essential (primary) hypertension: Secondary | ICD-10-CM | POA: Insufficient documentation

## 2018-01-07 DIAGNOSIS — H05242 Constant exophthalmos, left eye: Secondary | ICD-10-CM | POA: Insufficient documentation

## 2018-01-07 DIAGNOSIS — F17210 Nicotine dependence, cigarettes, uncomplicated: Secondary | ICD-10-CM | POA: Insufficient documentation

## 2018-01-07 LAB — CBC
HCT: 36.7 % (ref 36.0–46.0)
Hemoglobin: 12.3 g/dL (ref 12.0–15.0)
MCH: 29 pg (ref 26.0–34.0)
MCHC: 33.5 g/dL (ref 30.0–36.0)
MCV: 86.6 fL (ref 78.0–100.0)
PLATELETS: 303 10*3/uL (ref 150–400)
RBC: 4.24 MIL/uL (ref 3.87–5.11)
RDW: 13.6 % (ref 11.5–15.5)
WBC: 8.2 10*3/uL (ref 4.0–10.5)

## 2018-01-07 LAB — BASIC METABOLIC PANEL
ANION GAP: 12 (ref 5–15)
BUN: 9 mg/dL (ref 6–20)
CO2: 25 mmol/L (ref 22–32)
Calcium: 10 mg/dL (ref 8.9–10.3)
Chloride: 101 mmol/L (ref 101–111)
Creatinine, Ser: 0.83 mg/dL (ref 0.44–1.00)
GLUCOSE: 103 mg/dL — AB (ref 65–99)
POTASSIUM: 2.9 mmol/L — AB (ref 3.5–5.1)
Sodium: 138 mmol/L (ref 135–145)

## 2018-01-07 LAB — I-STAT BETA HCG BLOOD, ED (MC, WL, AP ONLY)

## 2018-01-07 LAB — TSH: TSH: 1.194 u[IU]/mL (ref 0.350–4.500)

## 2018-01-07 MED ORDER — IOPAMIDOL (ISOVUE-300) INJECTION 61%
INTRAVENOUS | Status: AC
  Administered 2018-01-07: 75 mL
  Filled 2018-01-07: qty 75

## 2018-01-07 MED ORDER — POTASSIUM CHLORIDE ER 20 MEQ PO TBCR: 20.0000 meq | EXTENDED_RELEASE_TABLET | Freq: Every day | ORAL | 0 refills | Status: DC

## 2018-01-07 NOTE — Discharge Instructions (Signed)
Please take your glasses tomorrow when you go to see ophthalmology.  Please go to Dr. Laruth BouchardGroat's office when they open at 8 AM.  Tomorrow is only half day.  You do not need to have an appointment.  Please just tell the receptionist that you are following up from the emergency department.  Dr. Dione BoozeGroat is expecting you.    It is extremely important that you follow-up with ophthalmology.  Take 1 tablet of potassium chloride daily for the next 30 days.  Your low potassium may be secondary to your hydrochlorothiazide medication.  Please do not stop taking this medication, but it may be worth mentioning at your next appointment with Dr. Ninetta LightsHatcher.  If you develop new or worsening symptoms, including loss of vision in the eye, fever, chills, or if the eye becomes red, hot, or swollen, please return to the emergency department for re-evaluation.

## 2018-01-07 NOTE — ED Notes (Signed)
Pt back in A5 from CT.

## 2018-01-07 NOTE — ED Notes (Addendum)
Visual acuity Right 20/80 Left 20/80

## 2018-01-07 NOTE — ED Provider Notes (Signed)
[facility] EMERGENCY DEPARTMENT Provider Note   CSN: [id number] Arrival date & time: 01/07/18  1126     History   Chief Complaint Chief Complaint  Patient presents with  . Eye Problem    HPI Sarah Watkins is a 37 y.o. female with history of HIV who presents to the emergency department with a chief complaint of left eye bulging that has been gradually worsening over the last few months.  She reports associated retro-orbital pressure that began 3-4 days ago and has been worsening since onset with associated photophobia.  He states that sometimes it feels as if the eye has a film over it or it feels more dry so she has been applying red eye saline drops.  She has no right eye complaints, fever, chills, diplopia, facial swelling, otalgia, headache, pain with movements of the eye, sinus pain or pressure, nasal congestion, dizziness, or lightheadedness. She has not been evaluated by ophthalmology.  She reports that the bulging is not better or worse at any time of the day.  No aggravating or alleviating factors.  She wears glasses at baseline, which she did not bring with her today, but reports that she has not had a new pair glasses in over 9 years.  The history is provided by the patient. No language interpreter was used.    Past Medical History:  Diagnosis Date  . Hepatitis B immune 07/28/2016  . HIV (human immunodeficiency virus infection) (HCC) 01/2013  . HTN (hypertension)   . Obesity (BMI 30.0-34.9)     Patient Active Problem List   Diagnosis Date Noted  . Essential hypertension 04/29/2017  . Hepatitis B immune 07/28/2016  . Influenza-like illness 11/09/2013  . HIV disease (HCC) 10/05/2013  . Dyslipidemia 10/05/2013  . Morbid obesity (HCC) 10/05/2013  . Cigarette smoker 10/05/2013  . Adjustment disorder with mixed anxiety and depressed mood 08/31/2013    Past Surgical History:  Procedure Laterality Date  . CESAREAN SECTION      OB History    No  data available       Home Medications    Prior to Admission medications   Medication Sig Start Date End Date Taking? Authorizing Provider  elvitegravir-cobicistat-emtricitabine-tenofovir (GENVOYA) 150-150-200-10 MG TABS tablet Take 1 tablet by mouth daily with breakfast. 04/29/17   Ginnie Smart, MD  FLUoxetine (PROZAC) 10 MG capsule Take 1 capsule (10 mg total) by mouth daily. 12/22/17   Ginnie Smart, MD  hydrochlorothiazide (HYDRODIURIL) 25 MG tablet Take 1 tablet (25 mg total) by mouth daily. 06/18/17   Ginnie Smart, MD  levonorgestrel (MIRENA) 20 MCG/24HR IUD 1 each by Intrauterine route once.    [provider]  ondansetron (ZOFRAN) 4 MG tablet Take 1 tablet (4 mg total) by mouth every 8 (eight) hours as needed for nausea or vomiting. 04/29/17   Ginnie Smart, MD  potassium chloride 20 MEQ TBCR Take 20 mEq by mouth daily. 01/07/18 02/06/18  Cadan Maggart, Coral Else, PA-C    Family History Family History  Problem Relation Age of Onset  . Hypertension Mother     Social History Social History   Tobacco Use  . Smoking status: Current Every Day Smoker    Packs/day: 0.30    Types: Cigarettes  . Smokeless tobacco: Never Used  Substance Use Topics  . Alcohol use: Yes    Comment: occ, social, less than weekly  . Drug use: Yes    Frequency: 7.0 times per week    Types:  Marijuana    Comment: marijuana/ rare     Allergies   Sulfa antibiotics   Review of Systems Review of Systems  Constitutional: Negative for activity change, chills and fever.  HENT: Negative for congestion, sinus pressure and sinus pain.   Eyes: Positive for photophobia and visual disturbance. Negative for redness.       Eye bulging   Respiratory: Negative for shortness of breath.   Cardiovascular: Negative for chest pain.  Gastrointestinal: Negative for abdominal pain.  Genitourinary: Negative for dysuria.  Musculoskeletal: Negative for back pain.  Skin: Negative for rash.    Allergic/Immunologic: Negative for immunocompromised state.  Neurological: Negative for weakness, numbness and headaches.  Psychiatric/Behavioral: Negative for confusion.     Physical Exam Updated Vital Signs BP 132/75 (BP Location: Left Arm)   Pulse (!) 54   Temp 98.6 F (37 C) (Oral)   Resp 14   LMP 01/05/2018 (Exact Date)   SpO2 99%   Physical Exam  Constitutional: No distress.  HENT:  Head: Normocephalic and atraumatic.  Right Ear: Hearing, external ear and ear canal normal. A middle ear effusion is present.  Left Ear: Hearing, external ear and ear canal normal. A middle ear effusion is present.  Nose: No mucosal edema. Right sinus exhibits no maxillary sinus tenderness and no frontal sinus tenderness. Left sinus exhibits no maxillary sinus tenderness and no frontal sinus tenderness.  Mouth/Throat: Uvula is midline and oropharynx is clear and moist.  Preauricular lymphadenopathy bilaterally.  Eyes: Conjunctivae and EOM are normal. Pupils are equal, round, and reactive to light. Right eye exhibits no discharge. Left eye exhibits no discharge. Right conjunctiva is not injected. Right conjunctiva has no hemorrhage. Left conjunctiva is not injected. Left conjunctiva has no hemorrhage. No scleral icterus. Right eye exhibits no nystagmus. Left eye exhibits no nystagmus.  Visual acuity is 20/80 for the left eye and 20/80 for the right eye without the patient's glasses.   Neck: Normal range of motion. Neck supple.  Cardiovascular: Normal rate, regular rhythm, normal heart sounds and intact distal pulses. Exam reveals no gallop and no friction rub.  No murmur heard. Pulmonary/Chest: Effort normal. No stridor. No respiratory distress. She has no wheezes. She has no rales. She exhibits no tenderness.  Abdominal: Soft. She exhibits no distension.  Neurological: She is alert.  Skin: Skin is warm. No rash noted.  Psychiatric: Her behavior is normal.  Nursing note and vitals  reviewed.    ED Treatments / Results  Labs (all labs ordered are listed, but only abnormal results are displayed) Labs Reviewed  BASIC METABOLIC PANEL - Abnormal; Notable for the following components:      Result Value   Potassium 2.9 (*)    Glucose, Bld 103 (*)    All other components within normal limits  CBC  TSH  I-STAT BETA HCG BLOOD, ED ([facility], [facility], [facility] ONLY)    EKG  EKG Interpretation None       Radiology Ct Head W Or Wo Contrast  Result Date: 01/07/2018 CLINICAL DATA:  Dizziness with bulging of the eye. EXAM: CT HEAD WITHOUT AND WITH CONTRAST CT ORBITS WITH CONTRAST TECHNIQUE: Multidetector CT imaging of the head was performed following the standard protocol before and after bolus injection of intravenous contrast. Multidetector CT imaging of the orbits were performed following the standard protocol during bolus administration of intravenous contrast. CONTRAST:  75mL ISOVUE-300 IOPAMIDOL (ISOVUE-300) INJECTION 61% COMPARISON:  Head CT 01/07/2018 FINDINGS: CT HEAD FINDINGS Brain: No mass lesion, intraparenchymal hemorrhage  or extra-axial collection. No evidence of acute cortical infarct. Brain parenchyma and CSF-containing spaces are normal for age. Vascular: No hyperdense vessel or unexpected calcification. Skull: Normal visualized skull base, calvarium and extracranial soft tissues. CT ORBITS FINDINGS Orbits: --Globes: Normal. --Bony orbit: Normal. --Preseptal soft tissues: Normal. --Intra- and extraconal orbital fat: Normal. No inflammatory stranding. --Optic nerves: Normal. --Lacrimal glands and fossae: Normal. --Extraocular muscles: Normal. Visualized sinuses:  No fluid levels or advanced mucosal thickening. Soft tissues: Normal. IMPRESSION: Normal CT of the head and orbits. Electronically Signed   By: Deatra Robinson M.D.   On: 01/07/2018 19:21   Ct Orbits W Contrast  Result Date: 01/07/2018 CLINICAL DATA:  Dizziness with bulging of the eye. EXAM: CT HEAD WITHOUT AND WITH CONTRAST  CT ORBITS WITH CONTRAST TECHNIQUE: Multidetector CT imaging of the head was performed following the standard protocol before and after bolus injection of intravenous contrast. Multidetector CT imaging of the orbits were performed following the standard protocol during bolus administration of intravenous contrast. CONTRAST:  75mL ISOVUE-300 IOPAMIDOL (ISOVUE-300) INJECTION 61% COMPARISON:  Head CT 01/07/2018 FINDINGS: CT HEAD FINDINGS Brain: No mass lesion, intraparenchymal hemorrhage or extra-axial collection. No evidence of acute cortical infarct. Brain parenchyma and CSF-containing spaces are normal for age. Vascular: No hyperdense vessel or unexpected calcification. Skull: Normal visualized skull base, calvarium and extracranial soft tissues. CT ORBITS FINDINGS Orbits: --Globes: Normal. --Bony orbit: Normal. --Preseptal soft tissues: Normal. --Intra- and extraconal orbital fat: Normal. No inflammatory stranding. --Optic nerves: Normal. --Lacrimal glands and fossae: Normal. --Extraocular muscles: Normal. Visualized sinuses:  No fluid levels or advanced mucosal thickening. Soft tissues: Normal. IMPRESSION: Normal CT of the head and orbits. Electronically Signed   By: Deatra Robinson M.D.   On: 01/07/2018 19:21    Procedures Procedures (including critical care time)  Medications Ordered in ED Medications  iopamidol (ISOVUE-300) 61 % injection (75 mLs  Contrast Given 01/07/18 1759)     Initial Impression / Assessment and Plan / ED Course  I have reviewed the triage vital signs and the nursing notes.  Pertinent labs & imaging results that were available during my care of the patient were reviewed by me and considered in my medical decision making (see chart for details).     37 year old female with a history of HIV presenting with left eye bulging, gradually worsening over the last few months with associated photophobia and retro-orbital pressure that began 3-4 days ago and has also been worsening.  She  has no constitutional symptoms.  No signs or symptoms of sinusitis, nasal congestion, preseptal or orbital cellulitis.  On physical exam, the patient does have direct photophobia, and both eyelids are noted to be twitching.  Visual acuity is 20/80 for the left eye and 20/80 you for the right eye without her glasses, which she does not have with her.  She has no leukocytosis.  BNP with K of 2.9, but otherwise unremarkable.  Will discharge her with a prescription of potassium chloride.  This is likely secondary to HCTZ.  TSH is 1.194.  CT orbits with contrast and head with and without contrast are unremarkable.  Doubt Grave's syndrome or etiology related to the paranasal sinuses.  The patient was discussed with Dr. Gwenlyn Fudge, attending physician.  Consulted ophthalmology and spoke with Dr. Dione Booze who does not feel that any additional emergent workup is indicated at this time and will see the patient in his clinic tomorrow morning.  Discussed this plan with the patient and the importance of ensuring that she  follows up.  She seems reliable and is agreeable to the plan at this time.  Strict return precautions given.  She is currently in no acute distress.  The patient is safe for discharge home at this time.  Final Clinical Impressions(s) / ED Diagnoses   Final diagnoses:  Exophthalmos of left eye    ED Discharge Orders        Ordered    potassium chloride 20 MEQ TBCR  Daily     01/07/18 2055       Frederik Pear A, PA-C 01/07/18 2215    Pricilla Loveless, MD 01/08/18 2226

## 2018-01-07 NOTE — ED Provider Notes (Signed)
Patient placed in Quick Look pathway, seen and evaluated   Chief Complaint: eye swelling, pain, and HA  HPI:   Patient states she has had swelling of the left eye for the past several weeks.  Today, she reports acute onset blurry vision.  She reports pain and pressure posterior to the eye, radiating along the left side of her head.  She reports pain with movement of her left eye upward.  She has consensual photophobia.  She denies fevers, chills, fall, trauma, or injury.  She denies ear pain, swelling elsewhere on the face.  She denies history of similar.  She wears glasses, not contacts.  Only other medical history includes HIV, viral load is undetectable.   ROS: Left eye pain and swelling  Physical Exam:   Gen: No distress  Neuro: Awake and Alert  Skin: Warm    Focused Exam: Mild edema of the left periorbital tissues.  Mild left-sided exophthalmos.  Tenderness to palpation of periorbital tissues.  EOMI.  Eyelids twitching bilaterally.  Cranial nerves intact.   Will obtain basic labs, TSH, and CT orbit and head for further evaluation.  Visual acuity ordered.   Initiation of care has begun. The patient has been counseled on the process, plan, and necessity for staying for the completion/evaluation, and the remainder of the medical screening examination    Alveria ApleyCaccavale,  [place], PA-C 01/07/18 2253    Pricilla LovelessGoldston, Scott, MD 01/08/18 2215

## 2018-01-07 NOTE — ED Triage Notes (Signed)
Pt is here with her left eye bulging and states she feels like pressure behind her eye and temple pressure.  Pt reports blurriness to left eye for several months.

## 2018-01-07 NOTE — ED Notes (Signed)
Pt out to CT. CT called to bring pt back to A5.

## 2018-01-07 NOTE — ED Notes (Signed)
ED Provider at bedside. 

## 2018-01-12 ENCOUNTER — Encounter: Payer: Self-pay | Admitting: Infectious Diseases

## 2018-03-01 ENCOUNTER — Other Ambulatory Visit: Payer: Self-pay | Admitting: Infectious Diseases

## 2018-03-01 DIAGNOSIS — F4323 Adjustment disorder with mixed anxiety and depressed mood: Secondary | ICD-10-CM

## 2018-03-05 ENCOUNTER — Emergency Department (HOSPITAL_COMMUNITY)
Admission: EM | Admit: 2018-03-05 | Discharge: 2018-03-07 | Disposition: A | Discharge status: Other Acute Inpatient Hospital | Payer: Self-pay | Attending: Emergency Medicine | Admitting: Emergency Medicine

## 2018-03-05 ENCOUNTER — Telehealth: Payer: Self-pay | Admitting: *Deleted

## 2018-03-05 ENCOUNTER — Encounter (HOSPITAL_COMMUNITY): Payer: Self-pay

## 2018-03-05 ENCOUNTER — Other Ambulatory Visit: Payer: Self-pay

## 2018-03-05 DIAGNOSIS — F332 Major depressive disorder, recurrent severe without psychotic features: Secondary | ICD-10-CM | POA: Insufficient documentation

## 2018-03-05 DIAGNOSIS — F32A Depression, unspecified: Secondary | ICD-10-CM

## 2018-03-05 DIAGNOSIS — F10920 Alcohol use, unspecified with intoxication, uncomplicated: Secondary | ICD-10-CM | POA: Insufficient documentation

## 2018-03-05 DIAGNOSIS — T50902A Poisoning by unspecified drugs, medicaments and biological substances, intentional self-harm, initial encounter: Secondary | ICD-10-CM | POA: Insufficient documentation

## 2018-03-05 DIAGNOSIS — F329 Major depressive disorder, single episode, unspecified: Secondary | ICD-10-CM

## 2018-03-05 DIAGNOSIS — Y906 Blood alcohol level of 120-199 mg/100 ml: Secondary | ICD-10-CM | POA: Insufficient documentation

## 2018-03-05 LAB — COMPREHENSIVE METABOLIC PANEL
ALK PHOS: 86 U/L (ref 38–126)
ALT: 12 U/L — ABNORMAL LOW (ref 14–54)
AST: 18 U/L (ref 15–41)
Albumin: 4.6 g/dL (ref 3.5–5.0)
Anion gap: 16 — ABNORMAL HIGH (ref 5–15)
BUN: 13 mg/dL (ref 6–20)
CALCIUM: 9.5 mg/dL (ref 8.9–10.3)
CO2: 22 mmol/L (ref 22–32)
CREATININE: 0.79 mg/dL (ref 0.44–1.00)
Chloride: 104 mmol/L (ref 101–111)
Glucose, Bld: 95 mg/dL (ref 65–99)
Potassium: 2.6 mmol/L — CL (ref 3.5–5.1)
SODIUM: 142 mmol/L (ref 135–145)
TOTAL PROTEIN: 8.7 g/dL — AB (ref 6.5–8.1)
Total Bilirubin: 0.3 mg/dL (ref 0.3–1.2)

## 2018-03-05 LAB — CBC
HCT: 40.7 % (ref 36.0–46.0)
HEMOGLOBIN: 14 g/dL (ref 12.0–15.0)
MCH: 30 pg (ref 26.0–34.0)
MCHC: 34.4 g/dL (ref 30.0–36.0)
MCV: 87.3 fL (ref 78.0–100.0)
PLATELETS: 333 10*3/uL (ref 150–400)
RBC: 4.66 MIL/uL (ref 3.87–5.11)
RDW: 14 % (ref 11.5–15.5)
WBC: 9 10*3/uL (ref 4.0–10.5)

## 2018-03-05 LAB — I-STAT CHEM 8, ED
BUN: 13 mg/dL (ref 6–20)
CALCIUM ION: 1.06 mmol/L — AB (ref 1.15–1.40)
CREATININE: 0.9 mg/dL (ref 0.44–1.00)
Chloride: 104 mmol/L (ref 101–111)
GLUCOSE: 96 mg/dL (ref 65–99)
HCT: 40 % (ref 36.0–46.0)
HEMOGLOBIN: 13.6 g/dL (ref 12.0–15.0)
Potassium: 3 mmol/L — ABNORMAL LOW (ref 3.5–5.1)
Sodium: 143 mmol/L (ref 135–145)
TCO2: 25 mmol/L (ref 22–32)

## 2018-03-05 LAB — ACETAMINOPHEN LEVEL

## 2018-03-05 LAB — SALICYLATE LEVEL

## 2018-03-05 LAB — I-STAT BETA HCG BLOOD, ED (MC, WL, AP ONLY)

## 2018-03-05 LAB — ETHANOL: ALCOHOL ETHYL (B): 192 mg/dL — AB (ref <10)

## 2018-03-05 LAB — CBG MONITORING, ED: GLUCOSE-CAPILLARY: 93 mg/dL (ref 65–99)

## 2018-03-05 MED ORDER — ACETAMINOPHEN 500 MG PO TABS
1000.0000 mg | ORAL_TABLET | Freq: Once | ORAL | Status: AC
  Administered 2018-03-05: 1000 mg via ORAL
  Filled 2018-03-05: qty 2

## 2018-03-05 MED ORDER — POTASSIUM CHLORIDE CRYS ER 20 MEQ PO TBCR
40.0000 meq | EXTENDED_RELEASE_TABLET | Freq: Once | ORAL | Status: AC
  Administered 2018-03-05: 40 meq via ORAL
  Filled 2018-03-05: qty 2

## 2018-03-05 MED ORDER — POTASSIUM CHLORIDE IN NACL 20-0.9 MEQ/L-% IV SOLN
Freq: Once | INTRAVENOUS | Status: AC
  Administered 2018-03-05: 22:00:00 via INTRAVENOUS
  Filled 2018-03-05: qty 1000

## 2018-03-05 NOTE — Telephone Encounter (Signed)
Patient was seen at the eye doctor and told she needs to have her thyroid checked as her eyes are different sizes. She also had a question about her IUD being removed. I advised her to contact the provider who placed it.

## 2018-03-05 NOTE — ED Notes (Signed)
Called poison control on [phone], [facility] the following: 6 hours observation with symptomatic support, repeat EKG in 3-4 hrs for QRS, and QT measures, salicylate and tylenol levels. Added that with the reported amount that patient took there is a less likelihood for acute changes in pt's condition.

## 2018-03-05 NOTE — ED Notes (Signed)
Bed: RESB Expected date:  Expected time:  Means of arrival:  Comments: EMS Prozac OD/ETOH

## 2018-03-05 NOTE — ED Notes (Signed)
EDMD at bedside

## 2018-03-05 NOTE — ED Provider Notes (Addendum)
[facility]-EMERGENCY DEPT Provider Note   CSN: [id number] Arrival date & time: 03/05/18  1927     History   Chief Complaint Chief Complaint  Patient presents with  . Drug Overdose    HPI Sarah Watkins PayorS Nicole is a 37 y.o. female.  Patient is a 37 year old female with a history of HIV and hypertension who presents with an overdose.  She states that she has been under a lot of stress recently and felt overwhelmed today.  She admits to taking an overdose of her Prozac.  She took a total of 4 throughout the day.  She also states she has been drinking alcohol.  She denies any other drug use other than occasional marijuana.  She has a headache but denies any other current complaints.  No nausea or vomiting.  No tremors.  No vision changes.  She denies any history of suicide attempts in the past.     Past Medical History:  Diagnosis Date  . Hepatitis B immune 07/28/2016  . HIV (human immunodeficiency virus infection) (HCC) 01/2013  . HTN (hypertension)   . Obesity (BMI 30.0-34.9)     Patient Active Problem List   Diagnosis Date Noted  . Essential hypertension 04/29/2017  . Hepatitis B immune 07/28/2016  . Influenza-like illness 11/09/2013  . HIV disease (HCC) 10/05/2013  . Dyslipidemia 10/05/2013  . Morbid obesity (HCC) 10/05/2013  . Cigarette smoker 10/05/2013  . Adjustment disorder with mixed anxiety and depressed mood 08/31/2013    Past Surgical History:  Procedure Laterality Date  . CESAREAN SECTION       OB History   None      Home Medications    Prior to Admission medications   Medication Sig Start Date End Date Taking? Authorizing Provider  elvitegravir-cobicistat-emtricitabine-tenofovir (GENVOYA) 150-150-200-10 MG TABS tablet Take 1 tablet by mouth daily with breakfast. 04/29/17  Yes Ginnie SmartHatcher, Jeffrey C, MD  FLUoxetine (PROZAC) 10 MG capsule TAKE 1 CAPSULE(10 MG) BY MOUTH DAILY 03/01/18  Yes Ginnie SmartHatcher, Jeffrey C, MD  hydrochlorothiazide  (HYDRODIURIL) 25 MG tablet Take 1 tablet (25 mg total) by mouth daily. 06/18/17  Yes Ginnie SmartHatcher, Jeffrey C, MD  levonorgestrel (MIRENA) 20 MCG/24HR IUD 1 each by Intrauterine route once.    [provider]  ondansetron (ZOFRAN) 4 MG tablet Take 1 tablet (4 mg total) by mouth every 8 (eight) hours as needed for nausea or vomiting. 04/29/17   Ginnie SmartHatcher, Jeffrey C, MD  potassium chloride 20 MEQ TBCR Take 20 mEq by mouth daily. 01/07/18 02/06/18  McDonald, Coral ElseMia A, PA-C    Family History Family History  Problem Relation Age of Onset  . Hypertension Mother     Social History Social History   Tobacco Use  . Smoking status: Current Every Day Smoker    Packs/day: 0.30    Types: Cigarettes  . Smokeless tobacco: Never Used  Substance Use Topics  . Alcohol use: Yes    Comment: occ, social, less than weekly  . Drug use: Yes    Frequency: 7.0 times per week    Types: Marijuana    Comment: marijuana/ rare     Allergies   Sulfa antibiotics   Review of Systems Review of Systems  Constitutional: Negative for chills, diaphoresis, fatigue and fever.  HENT: Negative for congestion, rhinorrhea and sneezing.   Eyes: Negative.   Respiratory: Negative for cough, chest tightness and shortness of breath.   Cardiovascular: Negative for chest pain and leg swelling.  Gastrointestinal: Negative for abdominal pain, blood in  stool, diarrhea, nausea and vomiting.  Genitourinary: Negative for difficulty urinating, flank pain, frequency and hematuria.  Musculoskeletal: Negative for arthralgias and back pain.  Skin: Negative for rash.  Neurological: Positive for headaches. Negative for dizziness, speech difficulty, weakness and numbness.  Psychiatric/Behavioral: Positive for dysphoric mood.     Physical Exam Updated Vital Signs BP 95/64 (BP Location: Left Arm)   Pulse 61   Temp 98.3 F (36.8 C) (Oral)   Resp 12   Ht 5\' 4"  (1.626 m)   Wt 104.8 kg (231 lb)   LMP 03/05/2018   SpO2 99%   BMI 39.65  kg/m   Physical Exam  Constitutional: She is oriented to person, place, and time. She appears well-developed and well-nourished.  HENT:  Head: Normocephalic and atraumatic.  Eyes: Pupils are equal, round, and reactive to light.  Neck: Normal range of motion. Neck supple.  Cardiovascular: Normal rate, regular rhythm and normal heart sounds.  Pulmonary/Chest: Effort normal and breath sounds normal. No respiratory distress. She has no wheezes. She has no rales. She exhibits no tenderness.  Abdominal: Soft. Bowel sounds are normal. There is no tenderness. There is no rebound and no guarding.  Musculoskeletal: Normal range of motion. She exhibits no edema.  Lymphadenopathy:    She has no cervical adenopathy.  Neurological: She is alert and oriented to person, place, and time.  Skin: Skin is warm and dry. No rash noted.  Psychiatric: She has a normal mood and affect.     ED Treatments / Results  Labs (all labs ordered are listed, but only abnormal results are displayed) Labs Reviewed  COMPREHENSIVE METABOLIC PANEL - Abnormal; Notable for the following components:      Result Value   Potassium 2.6 (*)    Total Protein 8.7 (*)    ALT 12 (*)    Anion gap 16 (*)    All other components within normal limits  ETHANOL - Abnormal; Notable for the following components:   Alcohol, Ethyl (B) 192 (*)    All other components within normal limits  ACETAMINOPHEN LEVEL - Abnormal; Notable for the following components:   Acetaminophen (Tylenol), Serum <10 (*)    All other components within normal limits  I-STAT CHEM 8, ED - Abnormal; Notable for the following components:   Potassium 3.0 (*)    Calcium, Ion 1.06 (*)    All other components within normal limits  SALICYLATE LEVEL  CBC  RAPID URINE DRUG SCREEN, HOSP PERFORMED  CBG MONITORING, ED  I-STAT BETA HCG BLOOD, ED ([facility], [facility], [facility] ONLY)    EKG EKG Interpretation  Date/Time:  Friday March 05 2018 22:56:49 EDT Ventricular Rate:  56 PR  Interval:    QRS Duration: 109 QT Interval:  478 QTC Calculation: 462 R Axis:   84 Text Interpretation:  Sinus rhythm Nonspecific T abnrm, anterolateral leads since last tracing no significant change Confirmed by Rolan Bucco [phone]) on 03/05/2018 10:59:50 PM   Radiology No results found.  Procedures Procedures (including critical care time)  Medications Ordered in ED Medications  potassium chloride SA (K-DUR,KLOR-CON) CR tablet 40 mEq (40 mEq Oral Given 03/05/18 2148)  0.9 % NaCl with KCl 20 mEq/ L  infusion ( Intravenous New Bag/Given 03/05/18 2149)  acetaminophen (TYLENOL) tablet 1,000 mg (1,000 mg Oral Given 03/05/18 2148)     Initial Impression / Assessment and Plan / ED Course  I have reviewed the triage vital signs and the nursing notes.  Pertinent labs & imaging results that were available  during my care of the patient were reviewed by me and considered in my medical decision making (see chart for details).     Patient is a 37 year old female who presents after ingesting 4 of her Prozac pills.  She also was noted to be intoxicated.  She has been moderate for 4-1/2 hours.  She has had no symptoms.  She does have hypokalemia and this was replaced in the ED. Her EKG shows no QRS widening or other acute abnormality.  She is awaiting TTS evaluation.  Pt was trying to leave, IVC papers initiated  Final Clinical Impressions(s) / ED Diagnoses   Final diagnoses:  Intentional drug overdose, initial encounter [facility])  Alcoholic intoxication without complication (HCC)  Depression, unspecified depression type    ED Discharge Orders    None       Rolan Bucco, MD 03/05/18 2346    Rolan Bucco, MD 03/05/18 1610    Rolan Bucco, MD 03/06/18 0040

## 2018-03-05 NOTE — ED Triage Notes (Signed)
Patient BIB EMS with complaints of prozac overdose. Patient reports history of depression and that she recently refilled her Prozac prescription. Patient reports taking "at least 4" prozac pills and "about of really cheap vodka." Patient repeating in triage "Im just overwhelmed. My life is just too much right now- Im a single mom and I work part time, and Im going back to school. Its just too much right now. I just needed to relax." Patient denies suicidal ideation. Patient denies suicide attempt. Patient tearful in triage.

## 2018-03-06 DIAGNOSIS — F332 Major depressive disorder, recurrent severe without psychotic features: Secondary | ICD-10-CM | POA: Diagnosis present

## 2018-03-06 MED ORDER — ACETAMINOPHEN 500 MG PO TABS
1000.0000 mg | ORAL_TABLET | Freq: Once | ORAL | Status: AC
  Administered 2018-03-06: 1000 mg via ORAL
  Filled 2018-03-06: qty 2

## 2018-03-06 MED ORDER — GABAPENTIN 100 MG PO CAPS
200.0000 mg | ORAL_CAPSULE | Freq: Two times a day (BID) | ORAL | Status: DC
  Administered 2018-03-06 – 2018-03-07 (×3): 200 mg via ORAL
  Filled 2018-03-06 (×3): qty 2

## 2018-03-06 MED ORDER — LORAZEPAM 2 MG/ML IJ SOLN
2.0000 mg | Freq: Once | INTRAMUSCULAR | Status: AC
  Administered 2018-03-06: 2 mg via INTRAMUSCULAR
  Filled 2018-03-06: qty 1

## 2018-03-06 MED ORDER — KETOROLAC TROMETHAMINE 30 MG/ML IJ SOLN
30.0000 mg | Freq: Once | INTRAMUSCULAR | Status: AC
  Administered 2018-03-06: 30 mg via INTRAVENOUS
  Filled 2018-03-06: qty 1

## 2018-03-06 MED ORDER — HYDROCHLOROTHIAZIDE 25 MG PO TABS: 25.0000 mg | ORAL_TABLET | Freq: Every day | ORAL | Status: DC

## 2018-03-06 MED ORDER — DULOXETINE HCL 30 MG PO CPEP
30.0000 mg | ORAL_CAPSULE | Freq: Every day | ORAL | Status: DC
Start: 2018-03-06 — End: 2018-03-07
  Administered 2018-03-06 – 2018-03-07 (×2): 30 mg via ORAL
  Filled 2018-03-06 (×2): qty 1

## 2018-03-06 MED ORDER — ELVITEG-COBIC-EMTRICIT-TENOFAF 150-150-200-10 MG PO TABS
1.0000 | ORAL_TABLET | Freq: Every day | ORAL | Status: DC
  Administered 2018-03-06: 1 via ORAL
  Filled 2018-03-06 (×2): qty 1

## 2018-03-06 NOTE — ED Notes (Signed)
[facility]

## 2018-03-06 NOTE — ED Notes (Signed)
Writer informed pt that she would be here for the night due IVC, Clinical research associatewriter offered pt medication either oral or IM pt chose IM medication and pt took med without trouble. Pt is now able to converse more that repeating the same phrase. Pt provided snack and went to bed.

## 2018-03-06 NOTE — ED Notes (Signed)
Pt in room pacing, sweating pulling at her hair and repeating over and over " got to go" EDP called for orders,

## 2018-03-06 NOTE — Progress Notes (Signed)
[facility] am--506-1, due to staffing issues.  Nanine MeansJamison Emilina Smarr, PMHNP

## 2018-03-06 NOTE — ED Notes (Signed)
Patient not going to [facility] until tomorrow morning after 9AM due to staffing issues.

## 2018-03-06 NOTE — BH Assessment (Addendum)
Assessment Note  Sarah Watkins is an 37 y.o. female, who presents involuntary and accompanied to [facility] by her mother. Pt was a poor historian during the assessment and was prompted numerous times to engage in the assessment. Clinician asked the pt, "what brought you to the hospital?" Pt reported, "I was drinking, took more med's than I normally would." Pt reported, taking a total of 5-6 antidepressants with alcohol because she was overwhelmed, aggravated and sad. Pt's mother reported, the pt is going to school, she has a son, and working. Pt reported, she took the medication at different times, she then went to her fiance's parking lot and called EMS to brought her to [facility]. Pt reported, she left a little suicidal earlier today and she took the medication to calm herself down. Pt reported, she was suicidal a couple months ago. Pt reported,a previous suicide attempt. Pt denies, HI, AVH, self-injurious behaviors and access to weapons.  Pt was IVC'd by the EDP. Per IVC paperwork: "Patient admits to being depressed. Says she took an overdose on her Prozac. Has been drinking alcohol."   Pt denied abuse. Pt reported, drinking three pints of liquor and smoking almost a pack of cigarettes, daily. Pt's BAL was 192 at 1945. Pt's UDS is pending. Pt reported, being linked to Dr. Ninetta Lights with Infectious Disease with [facility]. Pt reported, Dr. Ninetta Lights prescribes her antidepressant. Pt reported, a previous inpatient admission.  Pt presents sleeping scrubs with soft, slow speech. Pt's eye contact was poor. Pt's mood was depressed. Pt's affect was flat. Pt's thought process was circumstantial. Pt's judgement was impaired. Pt was oriented x4. Pt's concentration, insight and impulse control are poor. Pt reported, if discharged from [facility] she could contract for safety. Pt reported, if inpatient treatment is recommended she would not sign-in voluntarily.   Diagnosis:  F33.2 Major Depressive Disorder, recurrent, severe  without psychotic features.   Past Medical History:  Past Medical History:  Diagnosis Date  . Hepatitis B immune 07/28/2016  . HIV (human immunodeficiency virus infection) (HCC) 01/2013  . HTN (hypertension)   . Obesity (BMI 30.0-34.9)     Past Surgical History:  Procedure Laterality Date  . CESAREAN SECTION      Family History:  Family History  Problem Relation Age of Onset  . Hypertension Mother     Social History:  reports that she has been smoking cigarettes.  She has been smoking about 0.30 packs per day. She has never used smokeless tobacco. She reports that she drinks alcohol. She reports that she has current or past drug history. Drug: Marijuana. Frequency: 7.00 times per week.  Additional Social History:  Alcohol / Drug Use Pain Medications: See MAR Prescriptions: See MAR Over the Counter: See MAR History of alcohol / drug use?: Yes Substance #1 Name of Substance 1: Alcohol.  1 - Age of First Use: UTA 1 - Amount (size/oz): Pt reported, drinking pints of liquor, yesterday (03/05/2018). Pt's BAL was 192 at 1945. 1 - Frequency: UTA 1 - Duration: UTA 1 - Last Use / Amount: Pt reported, yesterday (03/06/2018). Substance #2 Name of Substance 2: Cigarettes. 2 - Age of First Use: UTA 2 - Amount (size/oz): Pt reported, smoking almost a pack of cigarettes, daily.  2 - Frequency: Daily. 2 - Duration: Ongoing. 2 - Last Use / Amount: Pt reported, daily.   CIWA: CIWA-Ar BP: (!) 110/59 Pulse Rate: (!) 57 COWS:    Allergies:  Allergies  Allergen Reactions  . Sulfa Antibiotics Hives, Itching, Swelling,  Rash and Other (See Comments)    Various gland swelling.    Home Medications:  (Not in a hospital admission)  OB/GYN Status:  Patient's last menstrual period was 03/05/2018.  General Assessment Data Location of Assessment: [facility] ED TTS Assessment: In system Is this a Tele or Face-to-Face Assessment?: Face-to-Face Is this an Initial Assessment or a Re-assessment for  this encounter?: Initial Assessment Marital status: Single Living Arrangements: Children, Parent Can pt return to current living arrangement?: Yes Admission Status: Voluntary Is patient capable of signing voluntary admission?: Yes Referral Source: Self/Family/Friend Insurance type: Self-pay.      Crisis Care Plan Living Arrangements: Children, Parent Legal Guardian: Other:(Self. ) Name of Psychiatrist: Dr. Ninetta LightsHatcher Name of Therapist: NA  Education Status Is patient currently in school?: Yes Current Grade: Pt in college.  Highest grade of school patient has completed: 12th grade.  Name of school: UTA Contact person: NA  Risk to self with the past 6 months Suicidal Ideation: No-Not Currently/Within Last 6 Months Has patient been a risk to self within the past 6 months prior to admission? : Yes Suicidal Intent: No-Not Currently/Within Last 6 Months Has patient had any suicidal intent within the past 6 months prior to admission? : Yes Is patient at risk for suicide?: No Suicidal Plan?: No(Pt denies. ) Has patient had any suicidal plan within the past 6 months prior to admission? : No Access to Means: (Pt's anti-depressants. ) What has been your use of drugs/alcohol within the last 12 months?: Alcohol and cigarettes.  Previous Attempts/Gestures: Yes How many times?: 1 Other Self Harm Risks: Pt denies. Triggers for Past Attempts: Unknown Intentional Self Injurious Behavior: None(Pt denies. ) Family Suicide History: No Recent stressful life event(s): Other (Comment)(school, her son, work[place] Persecutory voices/beliefs?: No Depression: Yes Depression Symptoms: Feeling angry/irritable, Feeling worthless/self pity, Loss of interest in usual pleasures, Guilt, Fatigue, Isolating, Tearfulness Substance abuse history and/or treatment for substance abuse?: No Suicide prevention information given to non-admitted patients: Not applicable  Risk to Others within  the past 6 months Homicidal Ideation: No(Pt denies. ) Does patient have any lifetime risk of violence toward others beyond the six months prior to admission? : No(Pt denies. ) Thoughts of Harm to Others: No(Pt denies. ) Current Homicidal Intent: No Current Homicidal Plan: No Access to Homicidal Means: No Identified Victim: NA History of harm to others?: No(Pt denies. ) Assessment of Violence: None Noted Violent Behavior Description: NA Does patient have access to weapons?: No(Pt denies. ) Criminal Charges Pending?: No Does patient have a court date: No Is patient on probation?: No  Psychosis Hallucinations: None noted Delusions: None noted  Mental Status Report Appearance/Hygiene: In scrubs Eye Contact: Poor Motor Activity: Unremarkable Speech: Soft, Slow Level of Consciousness: Sleeping Mood: Depressed Affect: Flat Anxiety Level: Minimal Thought Processes: Circumstantial Judgement: Impaired Orientation: Person, Place, Time, Situation Obsessive Compulsive Thoughts/Behaviors: None  Cognitive Functioning Concentration: Poor Memory: Recent Impaired Is patient IDD: No Is patient DD?: No Insight: Poor Impulse Control: Poor Appetite: Good Sleep: No Change Total Hours of Sleep: (Pt reported, "ok." ) Vegetative Symptoms: Unable to Assess  ADLScreening [facility] Patient's cognitive ability adequate to safely complete daily activities?: Yes Patient able to express need for assistance with ADLs?: Yes Independently performs ADLs?: Yes (appropriate for developmental age)  Prior Inpatient Therapy Prior Inpatient Therapy: Yes Prior Therapy Dates: UTA Prior Therapy Facilty/Provider(s): UTA Reason for Treatment: SI, depression.   Prior Outpatient Therapy Prior Outpatient Therapy: Yes Prior Therapy  Dates: Current Prior Therapy Facilty/Provider(s): Dr. Ninetta Lights, Infectious Disease Reason for Treatment: Medication management. Does patient have an ACCT team?:  No Does patient have Intensive In-House Services?  : No Does patient have [facility] services? : No Does patient have P4CC services?: No  ADL Screening (condition at time of admission) Patient's cognitive ability adequate to safely complete daily activities?: Yes Is the patient deaf or have difficulty hearing?: No Does the patient have difficulty seeing, even when wearing glasses/contacts?: Yes(Pt wears glasses. ) Does the patient have difficulty concentrating, remembering, or making decisions?: Yes Patient able to express need for assistance with ADLs?: Yes Does the patient have difficulty dressing or bathing?: No Independently performs ADLs?: Yes (appropriate for developmental age) Does the patient have difficulty walking or climbing stairs?: No Weakness of Legs: None Weakness of Arms/Hands: None  Home Assistive Devices/Equipment Home Assistive Devices/Equipment: Eyeglasses    Abuse/Neglect Assessment (Assessment to be complete while patient is alone) Abuse/Neglect Assessment Can Be Completed: Yes Physical Abuse: Denies(Pt denies. ) Verbal Abuse: Denies(Pt denies. ) Sexual Abuse: Denies(Pt denies. ) Exploitation of patient/patient's resources: Denies(Pt denies. ) Self-Neglect: Denies(Pt denies. )     Advance Directives (For Healthcare) Does Patient Have a Medical Advance Directive?: (UTA)    Additional Information 1:1 In Past 12 Months?: No CIRT Risk: No Elopement Risk: No Does patient have medical clearance?: Yes     Disposition: Nira Conn, NP recommends overnight observation for safety and stabilization. Disposition discussed with Dr. Fredderick Phenix and Cletis Athens, RN.   Disposition Initial Assessment Completed for this Encounter: Yes Disposition of Patient: ( overnight observation for safety and stabilization. ) Patient refused recommended treatment: No Mode of transportation if patient is discharged?: N/A  On Site Evaluation by:  Holly Bodily. Katori Wirsing, MS, LPC, CRC. Reviewed with  Physician:  Dr. Fredderick Phenix and Nira Conn, NP.  Redmond Pulling 03/06/2018 1:03 AM   Redmond Pulling, MS, [facility], CRC Triage Specialist [phone]

## 2018-03-06 NOTE — Progress Notes (Signed)
Per Jasmine PangShaleta, [facility], [facility] Accepting provider is Catha NottinghamJamison NP, Attending provider is Dr. Jama Flavorsobos MD. Patient can arrive by 8:00PM. Number for report is [phone]. CSW called Catha NottinghamJamison NP to notify of above.   Trula SladeHeather Smart, MSW, LCSW Clinical Social Worker 03/06/2018 12:44 PM

## 2018-03-07 ENCOUNTER — Inpatient Hospital Stay (HOSPITAL_COMMUNITY)
Admission: AD | Admit: 2018-03-07 | Discharge: 2018-03-12 | DRG: 885 | Disposition: A | Discharge status: Home / Self Care | Payer: No Typology Code available for payment source | Attending: Psychiatry | Admitting: Psychiatry

## 2018-03-07 ENCOUNTER — Other Ambulatory Visit: Payer: Self-pay

## 2018-03-07 ENCOUNTER — Encounter (HOSPITAL_COMMUNITY): Payer: Self-pay

## 2018-03-07 DIAGNOSIS — R45851 Suicidal ideations: Secondary | ICD-10-CM | POA: Diagnosis present

## 2018-03-07 DIAGNOSIS — H0520 Unspecified exophthalmos: Secondary | ICD-10-CM | POA: Diagnosis present

## 2018-03-07 DIAGNOSIS — F17210 Nicotine dependence, cigarettes, uncomplicated: Secondary | ICD-10-CM | POA: Diagnosis present

## 2018-03-07 DIAGNOSIS — F332 Major depressive disorder, recurrent severe without psychotic features: Principal | ICD-10-CM | POA: Diagnosis present

## 2018-03-07 DIAGNOSIS — Y906 Blood alcohol level of 120-199 mg/100 ml: Secondary | ICD-10-CM | POA: Diagnosis present

## 2018-03-07 DIAGNOSIS — R451 Restlessness and agitation: Secondary | ICD-10-CM | POA: Diagnosis not present

## 2018-03-07 DIAGNOSIS — F1290 Cannabis use, unspecified, uncomplicated: Secondary | ICD-10-CM | POA: Diagnosis not present

## 2018-03-07 DIAGNOSIS — Z21 Asymptomatic human immunodeficiency virus [HIV] infection status: Secondary | ICD-10-CM | POA: Diagnosis present

## 2018-03-07 DIAGNOSIS — F1099 Alcohol use, unspecified with unspecified alcohol-induced disorder: Secondary | ICD-10-CM

## 2018-03-07 DIAGNOSIS — Z6837 Body mass index (BMI) 37.0-37.9, adult: Secondary | ICD-10-CM | POA: Diagnosis not present

## 2018-03-07 DIAGNOSIS — R450 Nervousness: Secondary | ICD-10-CM

## 2018-03-07 DIAGNOSIS — E785 Hyperlipidemia, unspecified: Secondary | ICD-10-CM | POA: Diagnosis present

## 2018-03-07 DIAGNOSIS — Z79899 Other long term (current) drug therapy: Secondary | ICD-10-CM

## 2018-03-07 DIAGNOSIS — G4700 Insomnia, unspecified: Secondary | ICD-10-CM | POA: Diagnosis present

## 2018-03-07 DIAGNOSIS — F10129 Alcohol abuse with intoxication, unspecified: Secondary | ICD-10-CM | POA: Diagnosis present

## 2018-03-07 DIAGNOSIS — Z915 Personal history of self-harm: Secondary | ICD-10-CM | POA: Diagnosis not present

## 2018-03-07 DIAGNOSIS — F322 Major depressive disorder, single episode, severe without psychotic features: Secondary | ICD-10-CM | POA: Diagnosis not present

## 2018-03-07 DIAGNOSIS — I10 Essential (primary) hypertension: Secondary | ICD-10-CM | POA: Diagnosis present

## 2018-03-07 DIAGNOSIS — F419 Anxiety disorder, unspecified: Secondary | ICD-10-CM | POA: Diagnosis not present

## 2018-03-07 DIAGNOSIS — Z975 Presence of (intrauterine) contraceptive device: Secondary | ICD-10-CM | POA: Diagnosis not present

## 2018-03-07 LAB — BASIC METABOLIC PANEL
Anion gap: 8 (ref 5–15)
BUN: 18 mg/dL (ref 6–20)
CALCIUM: 9 mg/dL (ref 8.9–10.3)
CO2: 24 mmol/L (ref 22–32)
Chloride: 107 mmol/L (ref 101–111)
Creatinine, Ser: 0.81 mg/dL (ref 0.44–1.00)
GFR calc Af Amer: >60 mL/min (ref >60)
GLUCOSE: 95 mg/dL (ref 65–99)
POTASSIUM: 3.1 mmol/L — AB (ref 3.5–5.1)
Sodium: 139 mmol/L (ref 135–145)

## 2018-03-07 LAB — TSH: TSH: 0.47 u[IU]/mL (ref 0.350–4.500)

## 2018-03-07 MED ORDER — LORAZEPAM 1 MG PO TABS: 1.0000 mg | ORAL_TABLET | Freq: Four times a day (QID) | ORAL | Status: DC | PRN

## 2018-03-07 MED ORDER — MAGNESIUM HYDROXIDE 400 MG/5ML PO SUSP
30.0000 mL | Freq: Every day | ORAL | Status: DC | PRN
  Administered 2018-03-10: 30 mL via ORAL
  Filled 2018-03-07: qty 30

## 2018-03-07 MED ORDER — POTASSIUM CHLORIDE CRYS ER 20 MEQ PO TBCR
20.0000 meq | EXTENDED_RELEASE_TABLET | Freq: Two times a day (BID) | ORAL | Status: DC
  Administered 2018-03-07: 20 meq via ORAL
  Filled 2018-03-07: qty 1

## 2018-03-07 MED ORDER — ALUM & MAG HYDROXIDE-SIMETH 200-200-20 MG/5ML PO SUSP: 30.0000 mL | ORAL | Status: DC | PRN

## 2018-03-07 MED ORDER — LORAZEPAM 1 MG PO TABS: 1.0000 mg | ORAL_TABLET | Freq: Every day | ORAL | Status: DC

## 2018-03-07 MED ORDER — LORAZEPAM 1 MG PO TABS: 1.0000 mg | ORAL_TABLET | Freq: Two times a day (BID) | ORAL | Status: DC

## 2018-03-07 MED ORDER — TRAZODONE HCL 50 MG PO TABS
50.0000 mg | ORAL_TABLET | Freq: Every evening | ORAL | Status: DC | PRN
  Administered 2018-03-07 – 2018-03-11 (×4): 50 mg via ORAL
  Filled 2018-03-07 (×2): qty 1
  Filled 2018-03-07: qty 7
  Filled 2018-03-07 (×2): qty 1

## 2018-03-07 MED ORDER — ELVITEG-COBIC-EMTRICIT-TENOFAF 150-150-200-10 MG PO TABS
1.0000 | ORAL_TABLET | Freq: Every day | ORAL | Status: DC
  Administered 2018-03-08 – 2018-03-12 (×5): 1 via ORAL
  Filled 2018-03-07 (×6): qty 1

## 2018-03-07 MED ORDER — HYDROXYZINE HCL 25 MG PO TABS
25.0000 mg | ORAL_TABLET | Freq: Three times a day (TID) | ORAL | Status: DC | PRN
  Administered 2018-03-07: 25 mg via ORAL
  Filled 2018-03-07: qty 1

## 2018-03-07 MED ORDER — ONDANSETRON 4 MG PO TBDP
4.0000 mg | ORAL_TABLET | Freq: Four times a day (QID) | ORAL | Status: AC | PRN
Start: 2018-03-07 — End: 2018-03-10

## 2018-03-07 MED ORDER — THIAMINE HCL 100 MG/ML IJ SOLN
100.0000 mg | Freq: Once | INTRAMUSCULAR | Status: AC
  Administered 2018-03-07: 100 mg via INTRAMUSCULAR
  Filled 2018-03-07: qty 2

## 2018-03-07 MED ORDER — LORAZEPAM 1 MG PO TABS: 1.0000 mg | ORAL_TABLET | Freq: Three times a day (TID) | ORAL | Status: DC

## 2018-03-07 MED ORDER — SERTRALINE HCL 25 MG PO TABS
25.0000 mg | ORAL_TABLET | Freq: Every day | ORAL | Status: DC
  Administered 2018-03-07 – 2018-03-08 (×2): 25 mg via ORAL
  Filled 2018-03-07 (×4): qty 1

## 2018-03-07 MED ORDER — HYDROCHLOROTHIAZIDE 25 MG PO TABS
25.0000 mg | ORAL_TABLET | Freq: Every day | ORAL | Status: DC
  Administered 2018-03-08 – 2018-03-12 (×5): 25 mg via ORAL
  Filled 2018-03-07 (×7): qty 1

## 2018-03-07 MED ORDER — LORAZEPAM 1 MG PO TABS
1.0000 mg | ORAL_TABLET | Freq: Four times a day (QID) | ORAL | Status: DC
  Administered 2018-03-07: 1 mg via ORAL
  Filled 2018-03-07: qty 1

## 2018-03-07 MED ORDER — POTASSIUM CHLORIDE CRYS ER 20 MEQ PO TBCR: 20.0000 meq | EXTENDED_RELEASE_TABLET | Freq: Two times a day (BID) | ORAL | Status: DC

## 2018-03-07 MED ORDER — VITAMIN B-1 100 MG PO TABS
100.0000 mg | ORAL_TABLET | Freq: Every day | ORAL | Status: DC
  Administered 2018-03-08 – 2018-03-12 (×5): 100 mg via ORAL
  Filled 2018-03-07 (×6): qty 1

## 2018-03-07 MED ORDER — ADULT MULTIVITAMIN W/MINERALS CH
1.0000 | ORAL_TABLET | Freq: Every day | ORAL | Status: DC
  Administered 2018-03-08 – 2018-03-12 (×5): 1 via ORAL
  Filled 2018-03-07 (×7): qty 1

## 2018-03-07 MED ORDER — ACETAMINOPHEN 325 MG PO TABS
650.0000 mg | ORAL_TABLET | Freq: Four times a day (QID) | ORAL | Status: DC | PRN
  Administered 2018-03-07: 650 mg via ORAL
  Filled 2018-03-07: qty 2

## 2018-03-07 MED ORDER — LOPERAMIDE HCL 2 MG PO CAPS: 2.0000 mg | ORAL_CAPSULE | ORAL | Status: AC | PRN

## 2018-03-07 NOTE — H&P (Addendum)
Psychiatric Admission Assessment Adult  Patient Identification: Sarah Watkins MRN:  [id number] Date of Evaluation:  03/07/2018 Chief Complaint:  mdd Principal Diagnosis: <principal problem not specified> Diagnosis:   Patient Active Problem List   Diagnosis Date Noted  . MDD (major depressive disorder), severe ([place]) [F32.2] 03/07/2018  . Major depressive disorder, recurrent episode, severe ([place]) [F33.2] 03/06/2018  . Essential hypertension [I10] 04/29/2017  . Hepatitis B immune [Z78.9] 07/28/2016  . Influenza-like illness [R69] 11/09/2013  . HIV disease ([place]) [B20] 10/05/2013  . Dyslipidemia [E78.5] 10/05/2013  . Morbid obesity ([place]) [E66.01] 10/05/2013  . Cigarette smoker [F17.210] 10/05/2013  . Adjustment disorder with mixed anxiety and depressed mood [F43.23] 08/31/2013   History of Present Illness:  Per assessment note-  Sarah Watkins is an 37 y.o. female, who presents involuntary and accompanied to [facility] by her mother. Pt was a poor historian during the assessment and was prompted numerous times to engage in the assessment. Clinician asked the pt, "what brought you to the hospital?" Pt reported, "I was drinking, took more med's than I normally would." Pt reported, taking a total of 5-6 antidepressants with alcohol because she was overwhelmed, aggravated and sad. Pt's mother reported, the pt is going to school, she has a son, and working. Pt reported, she took the medication at different times, she then went to her fiance's parking lot and called EMS to brought her to [facility]. Pt reported, she left a little suicidal earlier today and she took the medication to calm herself down. Pt reported, she was suicidal a couple months ago. Pt reported,a previous suicide attempt. Pt denies, HI, AVH, self-injurious behaviors and access to weapons.Pt was IVC'd by the EDP. Per IVC paperwork: "Patient admits to being depressed. Says she took an overdose on her Prozac. Has been drinking alcohol."  Pt  denied abuse. Pt reported, drinking three pints of liquor and smoking almost a pack of cigarettes, daily. Pt's BAL was 192 at [facility]. Pt's UDS is pending. Pt reported, being linked to Dr. Johnnye Sima with Infectious Disease with [facility]. Pt reported, Dr. Johnnye Sima prescribes her antidepressant. Pt reported, a previous inpatient admission.  On Evaluation: Patient is awake and alert. Patient present flat and guarded " I am disappointed that I woke up." reports ' I have already told my mom to take care of my son."  Patient didn't elaborate with stressors, however reports she is just feeling overwhelmed. Patient validates the information that was provided in the above assessment. Patient continues to reports suicidal ideations however is able to contract for safety while on the unit. Denies homicidal, auditory or visual hallucinations. Support, reassurance and encouragement was provided.   Associated Signs/Symptoms: Depression Symptoms:  depressed mood, difficulty concentrating, hopelessness, suicidal attempt, (Hypo) Manic Symptoms:  Distractibility, Impulsivity, Irritable Mood, Anxiety Symptoms:  Excessive Worry, Psychotic Symptoms:  Paranoia, PTSD Symptoms: NA Total Time spent with patient: 20 minutes  Past Psychiatric History:   Is the patient at risk to self? Yes.    Has the patient been a risk to self in the past 6 months? Yes.    Has the patient been a risk to self within the distant past? Yes.    Is the patient a risk to others? No.  Has the patient been a risk to others in the past 6 months? No.  Has the patient been a risk to others within the distant past? No.   Prior Inpatient Therapy:   Prior Outpatient Therapy:    Alcohol Screening:   Substance  Abuse History in the last 12 months:  Yes.   Consequences of Substance Abuse: Withdrawal Symptoms:   Headaches Tremors Previous Psychotropic Medications:  Psychological Evaluations: Past Medical History:  Past Medical History:   Diagnosis Date  . Hepatitis B immune 07/28/2016  . HIV (human immunodeficiency virus infection) ([place]) 01/2013  . HTN (hypertension)   . Obesity (BMI 30.0-34.9)     Past Surgical History:  Procedure Laterality Date  . CESAREAN SECTION     Family History:  Family History  Problem Relation Age of Onset  . Hypertension Mother    Family Psychiatric  History:  Reports " my mother was in and out of mental hospitals" Tobacco Screening:   Social History:  Social History   Substance and Sexual Activity  Alcohol Use Yes   Comment: occ, social, less than weekly     Social History   Substance and Sexual Activity  Drug Use Yes  . Frequency: 7.0 times per week  . Types: Marijuana   Comment: marijuana/ rare    Additional Social History:                           Allergies:   Allergies  Allergen Reactions  . Sulfa Antibiotics Hives, Itching, Swelling, Rash and Other (See Comments)    Various gland swelling.   Lab Results:  Results for orders placed or performed during the hospital encounter of 03/05/18 (from the past 48 hour(s))  Comprehensive metabolic panel     Status: Abnormal   Collection Time: 03/05/18  7:45 PM  Result Value Ref Range   Sodium 142 135 - 145 mmol/L   Potassium 2.6 (LL) 3.5 - 5.1 mmol/L    Comment: CRITICAL RESULT CALLED TO, READ BACK BY AND VERIFIED WITH: RYAN,K. RN '@[email or web address]'  ON 04.05.19 BY COHEN,K    Chloride 104 101 - 111 mmol/L   CO2 22 22 - 32 mmol/L   Glucose, Bld 95 65 - 99 mg/dL   BUN 13 6 - 20 mg/dL   Creatinine, Ser 0.79 0.44 - 1.00 mg/dL   Calcium 9.5 8.9 - 10.3 mg/dL   Total Protein 8.7 (H) 6.5 - 8.1 g/dL   Albumin 4.6 3.5 - 5.0 g/dL   AST 18 15 - 41 U/L   ALT 12 (L) 14 - 54 U/L   Alkaline Phosphatase 86 38 - 126 U/L   Total Bilirubin 0.3 0.3 - 1.2 mg/dL   GFR calc non Af Amer >60 >60 mL/min   GFR calc Af Amer >60 >60 mL/min    Comment: (NOTE) The eGFR has been calculated using the CKD EPI equation. This calculation has not  been validated in all clinical situations. eGFR's persistently <60 mL/min signify possible Chronic Kidney Disease.    Anion gap 16 (H) 5 - 15    Comment: Performed at [facility], [place] [address], [place], [place] [address]  Ethanol     Status: Abnormal   Collection Time: 03/05/18  7:45 PM  Result Value Ref Range   Alcohol, Ethyl (B) 192 (H) <10 mg/dL    Comment:        LOWEST DETECTABLE LIMIT FOR SERUM ALCOHOL IS 10 mg/dL FOR MEDICAL PURPOSES ONLY Performed at [facility] [address], [place], [place] [address]   Salicylate level     Status: None   Collection Time: 03/05/18  7:45 PM  Result Value Ref Range   Salicylate Lvl <0.9 2.8 - 30.0 mg/dL  Comment: Performed at [facility], [place] [address], [place], [place] [address]  Acetaminophen level     Status: Abnormal   Collection Time: 03/05/18  7:45 PM  Result Value Ref Range   Acetaminophen (Tylenol), Serum <10 (L) 10 - 30 ug/mL    Comment:        THERAPEUTIC CONCENTRATIONS VARY SIGNIFICANTLY. A RANGE OF 10-30 ug/mL MAY BE AN EFFECTIVE CONCENTRATION FOR MANY PATIENTS. HOWEVER, SOME ARE BEST TREATED AT CONCENTRATIONS OUTSIDE THIS RANGE. ACETAMINOPHEN CONCENTRATIONS >150 ug/mL AT 4 HOURS AFTER INGESTION AND >50 ug/mL AT 12 HOURS AFTER INGESTION ARE OFTEN ASSOCIATED WITH TOXIC REACTIONS. Performed at [facility], [place] [address], [place], [place] [address]   cbc     Status: None   Collection Time: 03/05/18  7:45 PM  Result Value Ref Range   WBC 9.0 4.0 - 10.5 K/uL   RBC 4.66 3.87 - 5.11 MIL/uL   Hemoglobin 14.0 12.0 - 15.0 g/dL   HCT 40.7 36.0 - 46.0 %   MCV 87.3 78.0 - 100.0 fL   MCH 30.0 26.0 - 34.0 pg   MCHC 34.4 30.0 - 36.0 g/dL   RDW 14.0 11.5 - 15.5 %   Platelets 333 150 - 400 K/uL    Comment: Performed at [facility], [place] [address], [place], [place] [address]  I-Stat beta hCG blood, ED     Status: None    Collection Time: 03/05/18  7:56 PM  Result Value Ref Range   I-stat hCG, quantitative <5.0 <5 mIU/mL   Comment 3            Comment:   GEST. AGE      CONC.  (mIU/mL)   <=1 WEEK        5 - 50     2 WEEKS       50 - 500     3 WEEKS       100 - 10,000     4 WEEKS     1,000 - 30,000        FEMALE AND NON-PREGNANT FEMALE:     LESS THAN 5 mIU/mL   CBG monitoring, ED     Status: None   Collection Time: 03/05/18  8:07 PM  Result Value Ref Range   Glucose-Capillary 93 65 - 99 mg/dL  I-stat Chem 8, ED     Status: Abnormal   Collection Time: 03/05/18 11:37 PM  Result Value Ref Range   Sodium 143 135 - 145 mmol/L   Potassium 3.0 (L) 3.5 - 5.1 mmol/L   Chloride 104 101 - 111 mmol/L   BUN 13 6 - 20 mg/dL   Creatinine, Ser 0.90 0.44 - 1.00 mg/dL   Glucose, Bld 96 65 - 99 mg/dL   Calcium, Ion 1.06 (L) 1.15 - 1.40 mmol/L   TCO2 25 22 - 32 mmol/L   Hemoglobin 13.6 12.0 - 15.0 g/dL   HCT 40.0 36.0 - 46.0 %    Blood Alcohol level:  Lab Results  Component Value Date   ETH 192 (H) 03/05/2018   ETH <11 60/60/0459    Metabolic Disorder Labs:  Lab Results  Component Value Date   HGBA1C 5.3 04/29/2017   MPG 105 04/29/2017   No results found for: PROLACTIN Lab Results  Component Value Date   CHOL 243 (H) 12/07/2017   TRIG 112 12/07/2017   HDL 36 (L) 12/07/2017   CHOLHDL 6.8 (H) 12/07/2017   VLDL 26 04/29/2017  LDLCALC 183 (H) 12/07/2017   LDLCALC 168 (H) 04/29/2017    Current Medications: Current Facility-Administered Medications  Medication Dose Route Frequency Provider Last Rate Last Dose  . acetaminophen (TYLENOL) tablet 650 mg  650 mg Oral Q6H PRN Money, Lowry Ram, FNP      . alum & mag hydroxide-simeth (MAALOX/MYLANTA) 200-200-20 MG/5ML suspension 30 mL  30 mL Oral Q4H PRN Money, Lowry Ram, FNP      . hydrOXYzine (ATARAX/VISTARIL) tablet 25 mg  25 mg Oral TID PRN Money, Darnelle Maffucci B, FNP      . loperamide (IMODIUM) capsule 2-4 mg  2-4 mg Oral PRN Derrill Center, NP      .  LORazepam (ATIVAN) tablet 1 mg  1 mg Oral Q6H PRN Derrill Center, NP      . LORazepam (ATIVAN) tablet 1 mg  1 mg Oral QID Derrill Center, NP       Followed by  . [START ON 03/09/2018] LORazepam (ATIVAN) tablet 1 mg  1 mg Oral TID Derrill Center, NP       Followed by  . [START ON 03/10/2018] LORazepam (ATIVAN) tablet 1 mg  1 mg Oral BID Derrill Center, NP       Followed by  . [START ON 03/11/2018] LORazepam (ATIVAN) tablet 1 mg  1 mg Oral Daily Lewis, Tanika N, NP      . magnesium hydroxide (MILK OF MAGNESIA) suspension 30 mL  30 mL Oral Daily PRN Money, Darnelle Maffucci B, FNP      . multivitamin with minerals tablet 1 tablet  1 tablet Oral Daily Lewis, Tanika N, NP      . ondansetron (ZOFRAN-ODT) disintegrating tablet 4 mg  4 mg Oral Q6H PRN Derrill Center, NP      . thiamine (B-1) injection 100 mg  100 mg Intramuscular Once Derrill Center, NP      . Derrill Memo ON 03/08/2018] thiamine (VITAMIN B-1) tablet 100 mg  100 mg Oral Daily Derrill Center, NP      . traZODone (DESYREL) tablet 50 mg  50 mg Oral QHS PRN Money, Lowry Ram, FNP       PTA Medications: Medications Prior to Admission  Medication Sig Dispense Refill Last Dose  . elvitegravir-cobicistat-emtricitabine-tenofovir (GENVOYA) 150-150-200-10 MG TABS tablet Take 1 tablet by mouth daily with breakfast. 90 tablet 3 03/05/2018 at Unknown time  . FLUoxetine (PROZAC) 10 MG capsule TAKE 1 CAPSULE(10 MG) BY MOUTH DAILY 30 capsule 2 03/05/2018 at Unknown time  . hydrochlorothiazide (HYDRODIURIL) 25 MG tablet Take 1 tablet (25 mg total) by mouth daily. 30 tablet 11 03/05/2018 at Unknown time  . levonorgestrel (MIRENA) 20 MCG/24HR IUD 1 each by Intrauterine route once.   ongoing at unknown time  . ondansetron (ZOFRAN) 4 MG tablet Take 1 tablet (4 mg total) by mouth every 8 (eight) hours as needed for nausea or vomiting. 20 tablet 3 unknown  . potassium chloride 20 MEQ TBCR Take 20 mEq by mouth daily. 30 tablet 0     Musculoskeletal: Strength & Muscle Tone:  within normal limits Gait & Station: normal Patient leans: N/A  Psychiatric Specialty Exam: Physical Exam  Vitals reviewed. HENT:  Head: Normocephalic.  Cardiovascular: Normal rate.  Neurological: She is alert.  Psychiatric: She has a normal mood and affect. Her behavior is normal.    Review of Systems  Psychiatric/Behavioral: Positive for depression, substance abuse and suicidal ideas. The patient is nervous/anxious.   All other systems reviewed and are  negative.   Blood pressure 126/84, pulse 92, temperature 98.7 F (37.1 C), temperature source Oral, resp. rate 16, height '5\' 4"'  (1.626 m), weight 100.2 kg (221 lb), last menstrual period 03/05/2018, SpO2 100 %.Body mass index is 37.93 kg/m.  General Appearance: Casual  Eye Contact:  Fair  Speech:  Clear and Coherent  Volume:  Normal  Mood:  Anxious, Depressed and Dysphoric  Affect:  Blunt, Depressed and Flat  Thought Process:  Coherent  Orientation:  Full (Time, Place, and Person)  Thought Content:  Hallucinations: None  Suicidal Thoughts:  Yes.  with intent/plan  Homicidal Thoughts:  No  Memory:  Immediate;   Fair Recent;   Fair Remote;   Fair  Judgement:  Poor  Insight:  Present  Psychomotor Activity:  Normal  Concentration:  Concentration: Poor  Recall:  [employer] of Knowledge:  Fair  Language:  Good  Akathisia:  No  Handed:  Right  AIMS (if indicated):     Assets:  Communication Skills Desire for Improvement Physical Health Resilience Social Support  ADL's:  Intact  Cognition:  WNL  Sleep:       Treatment Plan Summary: Daily contact with patient to assess and evaluate symptoms and progress in treatment and Medication management   Continue with Trazodone 100 mg for insomnia Started on CWIA/ Ativan  Protocol Will continue to monitor vitals ,medication compliance and treatment side effects while patient is here.  Reviewed labs: potassium low 3.0  elevated ,BAL - 192, UDS -   ( Repeat potassium levl)   CSW will start working on disposition.  Patient to participate in therapeutic milieu  Observation Level/Precautions:  15 minute checks  Laboratory:  CBC Chemistry Profile UDS UA  Psychotherapy:  Individual and group session  Medications:  See chart   Consultations:  csw and psychiaty  Discharge Concerns:  Safety, stabilization, and risk of access to medication and medication stabilization   Estimated LOS:5-7 day   Other:     Physician Treatment Plan for Primary Diagnosis: <principal problem not specified> Long Term Goal(s): Improvement in symptoms so as ready for discharge  Short Term Goals: Ability to identify changes in lifestyle to reduce recurrence of condition will improve, Ability to disclose and discuss suicidal ideas, Ability to demonstrate self-control will improve and Ability to identify and develop effective coping behaviors will improve  Physician Treatment Plan for Secondary Diagnosis: Active Problems:   MDD (major depressive disorder), severe ([place])  Long Term Goal(s): Improvement in symptoms so as ready for discharge  Short Term Goals: Ability to identify changes in lifestyle to reduce recurrence of condition will improve, Ability to disclose and discuss suicidal ideas, Ability to demonstrate self-control will improve, Ability to identify and develop effective coping behaviors will improve, Ability to maintain clinical measurements within normal limits will improve and Ability to identify triggers associated with substance abuse/mental health issues will improve  I certify that inpatient services furnished can reasonably be expected to improve the patient's condition.    Derrill Center, NP 4/7/201911:58 AM   I have discussed case with NP and have met with patient  Agree with NP note and assessment  See demographic factors as above. Patient presented to the emergency room on April 5.  Reports worsening depression over recent weeks.  On day of admission reports she  had overdosed on Prozac.  States she took 4 tablets.  She does endorse suicidal intent and states "I was hoping I would not wake up" ( 4/5 EKG NSR  QTc 452) She cannot identify any specific stressors that may be causing or exacerbating her depression.  She does report recently identified exophthalmos for which she is in the process of being worked up. (February 7 head and orbit CAT scan read as normal, TSH on same date 1.194) She endorses daily cannabis use and also reports recent drinking and states she has been drinking up to 6 beers per day over the last 3 weeks or so, but she feels that depression preceded heavy drinking. Admission blood alcohol level 195. Endorses neurovegetative symptoms of depression to include poor sleep, poor appetite, low energy level, anhedonia, decreased concentration, she also describes a decreased sense of self-esteem.  Denies psychotic symptoms . In addition patient reports she has been socially isolated/avoidant.  Endorses irritability, but does not currently endorse other  symptoms suggestive of hypomania(no pressured speech, no "racing thoughts", no increased energy)  Medical history remarkable for HIV positive status, states she has been taking Genvoya for "a long time" and consistently up to admission.  Prozac had been started about 2 months ago at 10 mg a day.  Diagnoses-major depression, no psychotic features.  Cannabis use disorder.  Consider alcohol use disorder.  Plan-inpatient admission, we discussed treatment options.   She does not feel Prozac was helping.   Agrees to Zoloft trial.  Start 25 mg daily initially to minimize side effects, titrate as tolerated.   On (Ativan- PRN) alcohol detox protocol to minimize risk of alcohol withdrawal/  will also restart Genvoya.  Recheck BMP .

## 2018-03-07 NOTE — BH Assessment (Signed)
[phone]  [facility]

## 2018-03-07 NOTE — BHH Suicide Risk Assessment (Addendum)
[facility] Admission Suicide Risk Assessment   Nursing information obtained from:   Patient and chart Demographic factors:   37 year old single female, has 1 child aged 12 years old, lives with mother, employed, and college, studying to become EMT Current Mental Status:   See below Loss Factors:   Does not endorse any specific stressors, recently worked up for exophthalmos Historical Factors:   Reports history of depression.  1 prior psychiatric admission several years ago for depression. Risk Reduction Factors:   Resilience, sense of responsibility to family  Total Time spent with patient: 45 minutes Principal Problem: Major depression , no psychotic features  Diagnosis:   Patient Active Problem List   Diagnosis Date Noted  . MDD (major depressive disorder), severe ([facility]) [F32.2] 03/07/2018  . Major depressive disorder, recurrent episode, severe ([facility]) [F33.2] 03/06/2018  . Essential hypertension [I10] 04/29/2017  . Hepatitis B immune [Z78.9] 07/28/2016  . Influenza-like illness [R69] 11/09/2013  . HIV disease ([facility]) [B20] 10/05/2013  . Dyslipidemia [E78.5] 10/05/2013  . Morbid obesity ([facility]) [E66.01] 10/05/2013  . Cigarette smoker [F17.210] 10/05/2013  . Adjustment disorder with mixed anxiety and depressed mood [F43.23] 08/31/2013    Continued Clinical Symptoms:    The "Alcohol Use Disorders Identification Test", Guidelines for Use in Primary Care, Second Edition.  World Science writer [facility]). Score between 0-7:  no or low risk or alcohol related problems. Score between 8-15:  moderate risk of alcohol related problems. Score between 16-19:  high risk of alcohol related problems. Score 20 or above:  warrants further diagnostic evaluation for alcohol dependence and treatment.   CLINICAL FACTORS:  See demographic factors as above. Patient presented to the emergency room on April 5.  Reports worsening depression over recent weeks.  On day of admission reports she had overdosed on Prozac.   States she took 4 tablets.  She does endorse suicidal intent and states "I was hoping I would not wake up" ( 4/5 EKG NSR QTc 452) She cannot identify any specific stressors that may be causing or exacerbating her depression.  She does report recently identified exophthalmos for which she is in the process of being worked up. (February 7 head and orbit CAT scan read as normal, TSH on same date 1.194) She endorses daily cannabis use and also reports recent drinking and states she has been drinking up to 6 beers per day over the last 3 weeks or so, but she feels that depression preceded heavy drinking. Admission blood alcohol level 195. Endorses neurovegetative symptoms of depression to include poor sleep, poor appetite, low energy level, anhedonia, decreased concentration, she also describes a decreased sense of self-esteem.  Denies psychotic symptoms . In addition patient reports she has been socially isolated/avoidant.  Endorses irritability, but does not currently endorse other  symptoms suggestive of hypomania(no pressured speech, no "racing thoughts", no increased energy)  Medical history remarkable for HIV positive status, states she has been taking Genvoya for "a long time" and consistently up to admission.  Prozac had been started about 2 months ago at 10 mg a day.  Diagnoses-major depression, no psychotic features.  Cannabis use disorder.  Consider alcohol use disorder.  Plan-inpatient admission, we discussed treatment options.   She does not feel Prozac was helping.   Agrees to Zoloft trial.  Start 25 mg daily initially to minimize side effects, titrate as tolerated.   On (Ativan- PRN) alcohol detox protocol to minimize risk of alcohol withdrawal/  will also restart Genvoya.  Recheck BMP .  Musculoskeletal: Strength & Muscle Tone: within normal limits-currently no tremors, no diaphoresis, no acute distress or discomfort. Gait & Station: normal Patient leans: N/A  Psychiatric  Specialty Exam: Physical Exam  ROS endorses frequent headaches, no chest pain, no shortness of breath, no vomiting  Blood pressure 126/84, pulse 92, temperature 98.7 F (37.1 C), temperature source Oral, resp. rate 16, height 5\' 4"  (1.626 m), weight 100.2 kg (221 lb), last menstrual period 03/05/2018, SpO2 100 %.Body mass index is 37.93 kg/m.  General Appearance: Fairly Groomed  Eye Contact:  Fair  Speech:  Normal Rate  Volume:  Normal- not pressured   Mood:  Depressed  Affect:  Constricted  Thought Process:  Linear and Descriptions of Associations: Intact  Orientation:  Full (Time, Place, and Person)  Thought Content:  Denies hallucinations, no delusions, not internally preoccupied  Suicidal Thoughts:  No currently denies any suicidal plan or intention and contracts for safety on unit.  Denies homicidal ideations  Homicidal Thoughts:  No  Memory:  Recent and remote grossly intact  Judgement:  Fair  Insight:  Fair  Psychomotor Activity:  Decreased  Concentration:  Concentration: Good and Attention Span: Good  Recall:  Good  Fund of Knowledge:  Good  Language:  Good  Akathisia:  Negative  Handed:  Right  AIMS (if indicated):     Assets:  Desire for Improvement Resilience  ADL's:  Intact  Cognition:  WNL  Sleep:         COGNITIVE FEATURES THAT CONTRIBUTE TO RISK:  Closed-mindedness and Loss of executive function    SUICIDE RISK:   Moderate:  Frequent suicidal ideation with limited intensity, and duration, some specificity in terms of plans, no associated intent, good self-control, limited dysphoria/symptomatology, some risk factors present, and identifiable protective factors, including available and accessible social support.  PLAN OF CARE: Patient will be admitted to inpatient psychiatric unit for stabilization and safety. Will provide and encourage milieu participation. Provide medication management and maked adjustments as needed.  We will also provide medication  management to minimize risk of withdrawal-  will follow daily.    I certify that inpatient services furnished can reasonably be expected to improve the patient's condition.   Craige CottaFernando A Dorman Calderwood, MD 03/07/2018, 2:37 PM

## 2018-03-07 NOTE — Tx Team (Signed)
Initial Treatment Plan 03/07/2018 12:54 PM [name] PayorS Brooke EAV:[id number]    PATIENT STRESSORS: Financial difficulties Health problems Other: Mental health   PATIENT STRENGTHS: Ability for insight Average or above average intelligence Communication skills Supportive family/friends   PATIENT IDENTIFIED PROBLEMS:   "for months not wanting to be around people"     "I don't know what's going on with me"      "life period"         DISCHARGE CRITERIA:  Ability to meet basic life and health needs Improved stabilization in mood, thinking, and/or behavior Motivation to continue treatment in a less acute level of care Need for constant or close observation no longer present Reduction of life-threatening or endangering symptoms to within safe limits Verbal commitment to aftercare and medication compliance  PRELIMINARY DISCHARGE PLAN: Attend aftercare/continuing care group Outpatient therapy Return to previous living arrangement  PATIENT/FAMILY INVOLVEMENT: This treatment plan has been presented to and reviewed with the patient, Sarah Watkins,The patient has been given the opportunity to ask questions and make suggestions.  Shela NevinValerie S Valera Vallas, RN 03/07/2018, 12:54 PM

## 2018-03-07 NOTE — Plan of Care (Signed)
  Problem: Education: Goal: Knowledge of [place] [facility] Education information/materials will improve Outcome: Progressing Goal: Verbalization of understanding the information provided will improve Outcome: Progressing   

## 2018-03-07 NOTE — ED Notes (Signed)
[facility].
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
## 2018-03-07 NOTE — [facility] Group Notes (Signed)
[facility] LCSW Group Therapy Note  Date/Time: 03/07/18, 1100  Type of Therapy/Topic:  Group Therapy:  Feelings about Diagnosis  Participation Level:  Did Not Attend   Mood:   Description of Group:    This group will allow patients to explore their thoughts and feelings about diagnoses they have received. Patients will be guided to explore their level of understanding and acceptance of these diagnoses. Facilitator will encourage patients to process their thoughts and feelings about the reactions of others to their diagnosis, and will guide patients in identifying ways to discuss their diagnosis with significant others in their lives. This group will be process-oriented, with patients participating in exploration of their own experiences as well as giving and receiving support and challenge from other group members.   Therapeutic Goals: 1. Patient will demonstrate understanding of diagnosis as evidence by identifying two or more symptoms of the disorder:  2. Patient will be able to express two feelings regarding the diagnosis 3. Patient will demonstrate ability to communicate their needs through discussion and/or role plays  Summary of Patient Progress:        Therapeutic Modalities:   Cognitive Behavioral Therapy Brief Therapy Feelings Identification   Greg Promise Weldin, LCSW 

## 2018-03-07 NOTE — Progress Notes (Signed)
Patient's mother called with code- Sarah Watkins- this nurse spoke to mother with patient's permission. Mother expressed great concern for her daughter's change in mental status-  Mother reports that her daughter hasn't been herself and fears that if she were released that she would not be safe. Reassured mother of pt's safety and that she can call anytime. Mother to be reached at [phone]

## 2018-03-07 NOTE — Progress Notes (Signed)
Patient is a 37 year old female admitted under IVC from [facility]. Pt IVC'd due to suicide attempt by OD on prozac and alcohol. Pt reports being "disappointed" that her SA did not work. Pt unable to identify specific stressors other than being HIV positive- states, "I'm going to die anyway". Pt expresses feelings of hopelessness, reports that she hasn't wanted to be around people and has been isolating.  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 Pt reports that she used to enjoy her job, but doesn't enjoy being around people anymore. Pt reports that she is usually a 'social drinker', but for the past 2-3 weeks she has been coping by drinking 5-6 drinks (vodka) a day. Pt reports that her last drink was 2 days ago. Pt was calm,cooperative and appropriate during admission interview, but teared up frequently. Pt denies pain,SI/HI and A/V hallucinations at this time.  Vital signs obtained and were stable- Skin assessment revealed no abnormalities nor contraband. Admission paperwork completed and signed. Verbal understanding expressed. Belongings searched and secured in locker. Q 15 min checks initiated for safety. Pt oriented to unit. PO fluids given to pt.

## 2018-03-08 LAB — RAPID URINE DRUG SCREEN, HOSP PERFORMED
AMPHETAMINES: NOT DETECTED
BARBITURATES: NOT DETECTED
BENZODIAZEPINES: POSITIVE — AB
COCAINE: NOT DETECTED
Opiates: NOT DETECTED
Tetrahydrocannabinol: POSITIVE — AB

## 2018-03-08 LAB — PREGNANCY, URINE: PREG TEST UR: NEGATIVE

## 2018-03-08 MED ORDER — SERTRALINE HCL 50 MG PO TABS
50.0000 mg | ORAL_TABLET | Freq: Every day | ORAL | Status: DC
  Administered 2018-03-09 – 2018-03-12 (×4): 50 mg via ORAL
  Filled 2018-03-08 (×6): qty 1

## 2018-03-08 MED ORDER — OLANZAPINE 5 MG PO TBDP
5.0000 mg | ORAL_TABLET | Freq: Three times a day (TID) | ORAL | Status: DC | PRN
  Administered 2018-03-08: 5 mg via ORAL
  Filled 2018-03-08: qty 1

## 2018-03-08 MED ORDER — POTASSIUM CHLORIDE CRYS ER 20 MEQ PO TBCR
20.0000 meq | EXTENDED_RELEASE_TABLET | Freq: Two times a day (BID) | ORAL | Status: AC
  Administered 2018-03-08 – 2018-03-09 (×3): 20 meq via ORAL
  Filled 2018-03-08 (×6): qty 1

## 2018-03-08 MED ORDER — LORAZEPAM 1 MG PO TABS
1.0000 mg | ORAL_TABLET | Freq: Four times a day (QID) | ORAL | Status: DC | PRN
  Administered 2018-03-08: 1 mg via ORAL
  Filled 2018-03-08: qty 1
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
## 2018-03-08 NOTE — Progress Notes (Signed)
Pt observed lying down in bed resting at this time. Pt sitter at bedside. Pt do not appear to be in distress. Pt remains on 1:1 observation for safety.

## 2018-03-08 NOTE — Progress Notes (Signed)
Recreation Therapy Notes  Date: 4.8.19 Time: 9:30 a.m. Location: [address] Dayroom   Group Topic: Stress Management   Goal Area(s) Addresses:  Goal 1.1: To reduce stress  -Patient will feel a reduction in stress level  -Patient will learn the importance of stress management  -Patient will participate during stress management group      Intervention: Stress Management   Activity: Meditation- Patients were in a peaceful environment with soft lighting enhancing patients mood. Patients listened to a body scan meditation to help decrease tension and stress levels    Education: Stress Management, Discharge Planning.    Education Outcome: Acknowledges edcuation/In group clarification offered/Needs additional education   Clinical Observations/Feedback:: Patient did not attend     Sheryle HailDarian Philomena Buttermore, Recreation Therapy Intern   Sheryle HailDarian Tymeka Privette 03/08/2018 8:36 AM

## 2018-03-08 NOTE — Tx Team (Signed)
Interdisciplinary Treatment and Diagnostic Plan Update  03/08/2018 Time of Session: 10:35am [name] MRN: [id number]  Principal Diagnosis: <principal problem not specified>  Secondary Diagnoses: Active Problems:   MDD (major depressive disorder), severe (HCC)   Current Medications:  Current Facility-Administered Medications  Medication Dose Route Frequency Provider Last Rate Last Dose  . acetaminophen (TYLENOL) tablet 650 mg  650 mg Oral Q6H PRN Money, Lowry Ram, FNP   650 mg at 03/07/18 1907  . alum & mag hydroxide-simeth (MAALOX/MYLANTA) 200-200-20 MG/5ML suspension 30 mL  30 mL Oral Q4H PRN Money, Lowry Ram, FNP      . elvitegravir-cobicistat-emtricitabine-tenofovir (GENVOYA) 150-150-200-10 MG tablet 1 tablet  1 tablet Oral Q breakfast Cobos, Fernando A, MD      . hydrochlorothiazide (HYDRODIURIL) tablet 25 mg  25 mg Oral Daily Cobos, Myer Peer, MD   25 mg at 03/08/18 0804  . hydrOXYzine (ATARAX/VISTARIL) tablet 25 mg  25 mg Oral TID PRN Money, Lowry Ram, FNP   25 mg at 03/07/18 2116  . loperamide (IMODIUM) capsule 2-4 mg  2-4 mg Oral PRN Derrill Center, NP      . LORazepam (ATIVAN) tablet 1 mg  1 mg Oral Q6H PRN Derrill Center, NP      . magnesium hydroxide (MILK OF MAGNESIA) suspension 30 mL  30 mL Oral Daily PRN Money, Darnelle Maffucci B, FNP      . multivitamin with minerals tablet 1 tablet  1 tablet Oral Daily Derrill Center, NP   1 tablet at 03/08/18 0805  . ondansetron (ZOFRAN-ODT) disintegrating tablet 4 mg  4 mg Oral Q6H PRN Derrill Center, NP      . sertraline (ZOLOFT) tablet 25 mg  25 mg Oral Daily Cobos, Myer Peer, MD   25 mg at 03/08/18 0805  . thiamine (VITAMIN B-1) tablet 100 mg  100 mg Oral Daily Derrill Center, NP   100 mg at 03/08/18 0804  . traZODone (DESYREL) tablet 50 mg  50 mg Oral QHS PRN Money, Lowry Ram, FNP   50 mg at 03/07/18 2116   PTA Medications: Medications Prior to Admission  Medication Sig Dispense Refill Last Dose  .  elvitegravir-cobicistat-emtricitabine-tenofovir (GENVOYA) 150-150-200-10 MG TABS tablet Take 1 tablet by mouth daily with breakfast. 90 tablet 3 03/05/2018 at Unknown time  . FLUoxetine (PROZAC) 10 MG capsule TAKE 1 CAPSULE(10 MG) BY MOUTH DAILY 30 capsule 2 03/05/2018 at Unknown time  . hydrochlorothiazide (HYDRODIURIL) 25 MG tablet Take 1 tablet (25 mg total) by mouth daily. 30 tablet 11 03/05/2018 at Unknown time  . levonorgestrel (MIRENA) 20 MCG/24HR IUD 1 each by Intrauterine route once.   ongoing at unknown time  . ondansetron (ZOFRAN) 4 MG tablet Take 1 tablet (4 mg total) by mouth every 8 (eight) hours as needed for nausea or vomiting. 20 tablet 3 unknown  . potassium chloride 20 MEQ TBCR Take 20 mEq by mouth daily. 30 tablet 0     Patient Stressors: Financial difficulties Health problems Other: Mental health  Patient Strengths: Ability for insight Average or above average intelligence Communication skills Supportive family/friends  Treatment Modalities: Medication Management, Group therapy, Case management,  1 to 1 session with clinician, Psychoeducation, Recreational therapy.   Physician Treatment Plan for Primary Diagnosis: <principal problem not specified> Long Term Goal(s): Improvement in symptoms so as ready for discharge Improvement in symptoms so as ready for discharge   Short Term Goals: Ability to identify changes in lifestyle to reduce recurrence of condition will improve  Ability to disclose and discuss suicidal ideas Ability to demonstrate self-control will improve Ability to identify and develop effective coping behaviors will improve Ability to identify changes in lifestyle to reduce recurrence of condition will improve Ability to disclose and discuss suicidal ideas Ability to demonstrate self-control will improve Ability to identify and develop effective coping behaviors will improve Ability to maintain clinical measurements within normal limits will improve Ability  to identify triggers associated with substance abuse/mental health issues will improve  Medication Management: Evaluate patient's response, side effects, and tolerance of medication regimen.  Therapeutic Interventions: 1 to 1 sessions, Unit Group sessions and Medication administration.  Evaluation of Outcomes: Not Met  Physician Treatment Plan for Secondary Diagnosis: Active Problems:   MDD (major depressive disorder), severe ([place])  Long Term Goal(s): Improvement in symptoms so as ready for discharge Improvement in symptoms so as ready for discharge   Short Term Goals: Ability to identify changes in lifestyle to reduce recurrence of condition will improve Ability to disclose and discuss suicidal ideas Ability to demonstrate self-control will improve Ability to identify and develop effective coping behaviors will improve Ability to identify changes in lifestyle to reduce recurrence of condition will improve Ability to disclose and discuss suicidal ideas Ability to demonstrate self-control will improve Ability to identify and develop effective coping behaviors will improve Ability to maintain clinical measurements within normal limits will improve Ability to identify triggers associated with substance abuse/mental health issues will improve     Medication Management: Evaluate patient's response, side effects, and tolerance of medication regimen.  Therapeutic Interventions: 1 to 1 sessions, Unit Group sessions and Medication administration.  Evaluation of Outcomes: Not Met   RN Treatment Plan for Primary Diagnosis: <principal problem not specified> Long Term Goal(s): Knowledge of disease and therapeutic regimen to maintain health will improve  Short Term Goals: Ability to remain free from injury will improve, Ability to verbalize frustration and anger appropriately will improve, Ability to demonstrate self-control, Ability to participate in decision making will improve, Ability to  verbalize feelings will improve, Ability to disclose and discuss suicidal ideas, Ability to identify and develop effective coping behaviors will improve and Compliance with prescribed medications will improve  Medication Management: RN will administer medications as ordered by provider, will assess and evaluate patient's response and provide education to patient for prescribed medication. RN will report any adverse and/or side effects to prescribing provider.  Therapeutic Interventions: 1 on 1 counseling sessions, Psychoeducation, Medication administration, Evaluate responses to treatment, Monitor vital signs and CBGs as ordered, Perform/monitor CIWA, COWS, AIMS and Fall Risk screenings as ordered, Perform wound care treatments as ordered.  Evaluation of Outcomes: Not Met   LCSW Treatment Plan for Primary Diagnosis: <principal problem not specified> Long Term Goal(s): Safe transition to appropriate next level of care at discharge, Engage patient in therapeutic group addressing interpersonal concerns.  Short Term Goals: Engage patient in aftercare planning with referrals and resources, Increase social support, Increase ability to appropriately verbalize feelings, Increase emotional regulation, Facilitate acceptance of mental health diagnosis and concerns, Facilitate patient progression through stages of change regarding substance use diagnoses and concerns, Identify triggers associated with mental health/substance abuse issues and Increase skills for wellness and recovery  Therapeutic Interventions: Assess for all discharge needs, 1 to 1 time with Social worker, Explore available resources and support systems, Assess for adequacy in community support network, Educate family and significant other(s) on suicide prevention, Complete Psychosocial Assessment, Interpersonal group therapy.  Evaluation of Outcomes: Not Met   Progress  in Treatment: Attending groups: No. Participating in groups:  No. Taking medication as prescribed: Yes. Toleration medication: Yes. Family/Significant other contact made: No, will contact:  the patient's mother, Clifton James Patient understands diagnosis: Yes. Discussing patient identified problems/goals with staff: Yes. Medical problems stabilized or resolved: Yes. Denies suicidal/homicidal ideation: Yes. Issues/concerns per patient self-inventory: No. Other:  New problem(s) identified: None  New Short Term/Long Term Goal(s): medication stabilization, elimination of SI thoughts, development of comprehensive mental wellness plan.   Patient Goals: "I just want to feel better"  Discharge Plan or Barriers: CSW will continue to assess for an appropriate discharge plan.   Reason for Continuation of Hospitalization: Depression Medication stabilization  Estimated Length of Stay: Friday, 03/12/18  Attendees: Patient: Sarah Watkins 03/08/2018 8:56 AM  Physician: Dr. Neita Garnet 03/08/2018 8:56 AM  Nursing: Darrol Angel, RN 03/08/2018 8:56 AM  RN Care Manager: Rhunette Croft 03/08/2018 8:56 AM  Social Worker: Radonna Ricker, [place] 03/08/2018 8:56 AM  Recreational Therapist: Rhunette Croft 03/08/2018 8:56 AM  Other: X 03/08/2018 8:56 AM  Other: X 03/08/2018 8:56 AM  Other:X 03/08/2018 8:56 AM    Scribe for Treatment Team: Marylee Floras, [place] 03/08/2018 8:56 AM

## 2018-03-08 NOTE — Progress Notes (Signed)
Writer spoke with patient 1:1 and was observed lying in bed resting but not asleep. Writer encouraged her to come out of her room more and try not to isolate. She reports feeling very irritable today and has stayed in her room. Writer encouraged her to come to dayroom more often and attend groups. Support given and safety maintained on unit with 15 min checks.

## 2018-03-08 NOTE — Progress Notes (Signed)
Psychoeducational Group Note  Date:  03/08/2018 Time:  2049  Group Topic/Focus:  Wrap-Up Group:   The focus of this group is to help patients review their daily goal of treatment and discuss progress on daily workbooks.  Participation Level: Did Not Attend  Participation Quality:  Not Applicable  Affect:  Not Applicable  Cognitive:  Not Applicable  Insight:  Not Applicable  Engagement in Group: Not Applicable  Additional Comments:  The patient did not attend group since she was asleep.   Hazle CocaGOODMAN, Davia Smyre S 03/08/2018, 8:49 PM

## 2018-03-08 NOTE — Progress Notes (Addendum)
[facility] MD Progress Note  03/08/2018 10:22 AM Sarah Watkins  MRN:  [id number] Subjective:  Patient continues to endorse feeling depressed. States she " feels like my life is going nowhere". She denies suicidal plans or intentions but endorses passive SI, and acknowledges she refused antiretroviral medication earlier today because " I don't care if I get sick and die". Denies medication side effects. Of note , patient does not feel recent diagnosis of exophthalmos is contributing to her depression. States " it is kind of aggravating, that's it". Objective : I have met with patient and have discussed case with treatment team.  37 year old single female, employed. Reports worsening depression. Presented following overdosing on Prozac ( states she took four tablets), which she states was suicidal in intent.  Patient presents depressed, constricted, but affect is slightly more reactive, smiles briefly at times . Denies medication side effects thus far . Polite on approach, not irritable or angry today, tends to isolate on unit . Poor PO intake reported by staff .  Labs reviewed as below- TSH WNL ( 0.47), preg test negative, K(+) 3.1 EKG NSR, QTc 451   Principal Problem: MDD Diagnosis:   Patient Active Problem List   Diagnosis Date Noted  . MDD (major depressive disorder), severe ([place]) [F32.2] 03/07/2018  . Major depressive disorder, recurrent episode, severe ([place]) [F33.2] 03/06/2018  . Essential hypertension [I10] 04/29/2017  . Hepatitis B immune [Z78.9] 07/28/2016  . Influenza-like illness [R69] 11/09/2013  . HIV disease ([place]) [B20] 10/05/2013  . Dyslipidemia [E78.5] 10/05/2013  . Morbid obesity ([place]) [E66.01] 10/05/2013  . Cigarette smoker [F17.210] 10/05/2013  . Adjustment disorder with mixed anxiety and depressed mood [F43.23] 08/31/2013   Total Time spent with patient: 20 minutes  Past Medical History:  Past Medical History:  Diagnosis Date  . Hepatitis B immune 07/28/2016  . HIV  (human immunodeficiency virus infection) ([place]) 01/2013  . HTN (hypertension)   . Obesity (BMI 30.0-34.9)     Past Surgical History:  Procedure Laterality Date  . CESAREAN SECTION     Family History:  Family History  Problem Relation Age of Onset  . Hypertension Mother    Social History:  Social History   Substance and Sexual Activity  Alcohol Use Yes   Comment: occ, social, less than weekly     Social History   Substance and Sexual Activity  Drug Use Yes  . Frequency: 7.0 times per week  . Types: Marijuana   Comment: marijuana/ rare    Social History   Socioeconomic History  . Marital status: Single    Spouse name: Not on file  . Number of children: Not on file  . Years of education: Not on file  . Highest education level: Not on file  Occupational History  . Not on file  Social Needs  . Financial resource strain: Not on file  . Food insecurity:    Worry: Not on file    Inability: Not on file  . Transportation needs:    Medical: Not on file    Non-medical: Not on file  Tobacco Use  . Smoking status: Current Every Day Smoker    Packs/day: 0.30    Types: Cigarettes  . Smokeless tobacco: Never Used  Substance and Sexual Activity  . Alcohol use: Yes    Comment: occ, social, less than weekly  . Drug use: Yes    Frequency: 7.0 times per week    Types: Marijuana    Comment: marijuana/ rare  .  Sexual activity: Not Currently    Partners: Male    Birth control/protection: IUD    Comment: pt given condoms  Lifestyle  . Physical activity:    Days per week: Not on file    Minutes per session: Not on file  . Stress: Not on file  Relationships  . Social connections:    Talks on phone: Not on file    Gets together: Not on file    Attends religious service: Not on file    Active member of club or organization: Not on file    Attends meetings of clubs or organizations: Not on file    Relationship status: Not on file  Other Topics Concern  . Not on file   Social History Narrative  . Not on file   Additional Social History:   Sleep: states she slept well last night   Appetite:  Fair  Current Medications: Current Facility-Administered Medications  Medication Dose Route Frequency Provider Last Rate Last Dose  . acetaminophen (TYLENOL) tablet 650 mg  650 mg Oral Q6H PRN Money, Lowry Ram, FNP   650 mg at 03/07/18 1907  . alum & mag hydroxide-simeth (MAALOX/MYLANTA) 200-200-20 MG/5ML suspension 30 mL  30 mL Oral Q4H PRN Money, Lowry Ram, FNP      . elvitegravir-cobicistat-emtricitabine-tenofovir (GENVOYA) 150-150-200-10 MG tablet 1 tablet  1 tablet Oral Q breakfast Atalya Dano A, MD      . hydrochlorothiazide (HYDRODIURIL) tablet 25 mg  25 mg Oral Daily Mehak Roskelley, Myer Peer, MD   25 mg at 03/08/18 0804  . hydrOXYzine (ATARAX/VISTARIL) tablet 25 mg  25 mg Oral TID PRN Money, Lowry Ram, FNP   25 mg at 03/07/18 2116  . loperamide (IMODIUM) capsule 2-4 mg  2-4 mg Oral PRN Derrill Center, NP      . LORazepam (ATIVAN) tablet 1 mg  1 mg Oral Q6H PRN Derrill Center, NP      . magnesium hydroxide (MILK OF MAGNESIA) suspension 30 mL  30 mL Oral Daily PRN Money, Darnelle Maffucci B, FNP      . multivitamin with minerals tablet 1 tablet  1 tablet Oral Daily Derrill Center, NP   1 tablet at 03/08/18 0805  . ondansetron (ZOFRAN-ODT) disintegrating tablet 4 mg  4 mg Oral Q6H PRN Derrill Center, NP      . sertraline (ZOLOFT) tablet 25 mg  25 mg Oral Daily Parrish Daddario, Myer Peer, MD   25 mg at 03/08/18 0805  . thiamine (VITAMIN B-1) tablet 100 mg  100 mg Oral Daily Derrill Center, NP   100 mg at 03/08/18 0804  . traZODone (DESYREL) tablet 50 mg  50 mg Oral QHS PRN Money, Lowry Ram, FNP   50 mg at 03/07/18 2116    Lab Results:  Results for orders placed or performed during the hospital encounter of 03/07/18 (from the past 48 hour(s))  TSH     Status: None   Collection Time: 03/07/18 12:25 PM  Result Value Ref Range   TSH 0.470 0.350 - 4.500 uIU/mL    Comment: Performed  by a 3rd Generation assay with a functional sensitivity of <=0.01 uIU/mL. Performed at [facility], [place] [address], [place], [place] [address]   Basic metabolic panel     Status: Abnormal   Collection Time: 03/07/18  6:41 PM  Result Value Ref Range   Sodium 139 135 - 145 mmol/L   Potassium 3.1 (L) 3.5 - 5.1 mmol/L   Chloride 107 101 -  111 mmol/L   CO2 24 22 - 32 mmol/L   Glucose, Bld 95 65 - 99 mg/dL   BUN 18 6 - 20 mg/dL   Creatinine, Ser 0.81 0.44 - 1.00 mg/dL   Calcium 9.0 8.9 - 10.3 mg/dL   GFR calc non Af Amer >60 >60 mL/min   GFR calc Af Amer >60 >60 mL/min    Comment: (NOTE) The eGFR has been calculated using the CKD EPI equation. This calculation has not been validated in all clinical situations. eGFR's persistently <60 mL/min signify possible Chronic Kidney Disease.    Anion gap 8 5 - 15    Comment: Performed at [facility], [place] [address], [place], [place] [address]  Rapid urine drug screen (hospital performed)     Status: Abnormal   Collection Time: 03/07/18  9:45 PM  Result Value Ref Range   Opiates NONE DETECTED NONE DETECTED   Cocaine NONE DETECTED NONE DETECTED   Benzodiazepines POSITIVE (A) NONE DETECTED   Amphetamines NONE DETECTED NONE DETECTED   Tetrahydrocannabinol POSITIVE (A) NONE DETECTED   Barbiturates NONE DETECTED NONE DETECTED    Comment: (NOTE) DRUG SCREEN FOR MEDICAL PURPOSES ONLY.  IF CONFIRMATION IS NEEDED FOR ANY PURPOSE, NOTIFY LAB WITHIN 5 DAYS. LOWEST DETECTABLE LIMITS FOR URINE DRUG SCREEN Drug Class                     Cutoff (ng/mL) Amphetamine and metabolites    1000 Barbiturate and metabolites    200 Benzodiazepine                 025 Tricyclics and metabolites     300 Opiates and metabolites        300 Cocaine and metabolites        300 THC                            50 Performed at [facility], [place] [address] [place], [place] [address]   Pregnancy, urine      Status: None   Collection Time: 03/07/18  9:45 PM  Result Value Ref Range   Preg Test, Ur NEGATIVE NEGATIVE    Comment:        THE SENSITIVITY OF THIS METHODOLOGY IS >20 mIU/mL. Performed at [facility], [place] [address] [place], [place] [address]     Blood Alcohol level:  Lab Results  Component Value Date   ETH 192 (H) 03/05/2018   ETH <11 83/15/1761    Metabolic Disorder Labs: Lab Results  Component Value Date   HGBA1C 5.3 04/29/2017   MPG 105 04/29/2017   No results found for: PROLACTIN Lab Results  Component Value Date   CHOL 243 (H) 12/07/2017   TRIG 112 12/07/2017   HDL 36 (L) 12/07/2017   CHOLHDL 6.8 (H) 12/07/2017   VLDL 26 04/29/2017   LDLCALC 183 (H) 12/07/2017   LDLCALC 168 (H) 04/29/2017    Physical Findings: AIMS:  , ,  ,  ,    CIWA:  CIWA-Ar Total: 0 COWS:     Musculoskeletal: Strength & Muscle Tone: within normal limits Gait & Station: normal Patient leans: N/A  Psychiatric Specialty Exam: Physical Exam  ROS denies headache, no chest pain, no dyspnea, no fever, no chills   Blood pressure 115/62, pulse 69, temperature 98.2 F (36.8 C), temperature source Oral, resp. rate 20, height '5\' 4"'  (1.626 m), weight 100.2 kg (221  lb), last menstrual period 03/05/2018, SpO2 100 %.Body mass index is 37.93 kg/m.  General Appearance: Fairly Groomed  Eye Contact:  Fair  Speech:  Normal Rate  Volume:  Decreased  Mood:  remains depressed   Affect:  constricted, sad, not irritable , becomes slightly more reactive during session  Thought Process:  Linear and Descriptions of Associations: Intact  Orientation:  Other:  fully alert and attentive   Thought Content:  denies hallucinations, no delusions, not internally preoccupied   Suicidal Thoughts:  Yes.  without intent/plan- denies plan or intention of Suicide or self injurious ideations, but does endorse passive thoughts of death, dying . Contracts for safety on unit   Homicidal Thoughts:  No   Memory:  recent and remote grossly intact   Judgement:  Fair  Insight:  Fair  Psychomotor Activity:  Decreased  Concentration:  Concentration: Good and Attention Span: Good  Recall:  Good  Fund of Knowledge:  Good  Language:  Good  Akathisia:  Negative  Handed:  Right  AIMS (if indicated):     Assets:  Communication Skills Desire for Improvement Resilience  ADL's:  Intact  Cognition:  WNL  Sleep:  Number of Hours: 6.5   Assessment - patient remains depressed, constricted in affect, endorsing some passive SI, but denies any suicidal plan or intention at present , and contracts for safety on the unit . Not irritable or expansive at present and no current symptoms of mania or mixed presentation. She is not presenting with symptoms of alcohol WDL- no tremors, no diaphoresis, no restlessness, vitals stable . Denies medication side effects thus far - was started on Zoloft . States she did not take antiretroviral medication because she does not care if she becomes physically ill, but denies having side effects. I have encouraged her to take medication as prescribed, and she stated she would take it .    Treatment Plan Summary: Daily contact with patient to assess and evaluate symptoms and progress in treatment, Medication management, Plan inpatient treatment  and medications as below Encourage group and milieu participation to work on coping skills and symptom reduction. Increase Zoloft to 50 mgrs QDAY for depression Continue Trazodone 50 mgrs QHS PRN for insomnia Continue HCTZ for HTN Continue Ativan PRNs for potential alcohol WDL symptoms Continue Genvoya daily for HIV/ antiretroviral management. KDUR supplementation for hypokalemia. Recheck  BMP in AM.  Encourage PO fluids .  Treatment team working on disposition planning options  Jenne Campus, MD 03/08/2018, 10:22 AM

## 2018-03-08 NOTE — [facility] Group Notes (Signed)
[facility] LCSW Group Therapy Note  Date/Time: 03/08/18, 1315  Type of Therapy and Topic:  Group Therapy:  Overcoming Obstacles  Participation Level:  Did not attend  Description of Group:    In this group patients will be encouraged to explore what they see as obstacles to their own wellness and recovery. They will be guided to discuss their thoughts, feelings, and behaviors related to these obstacles. The group will process together ways to cope with barriers, with attention given to specific choices patients can make. Each patient will be challenged to identify changes they are motivated to make in order to overcome their obstacles. This group will be process-oriented, with patients participating in exploration of their own experiences as well as giving and receiving support and challenge from other group members.  Therapeutic Goals: 1. Patient will identify personal and current obstacles as they relate to admission. 2. Patient will identify barriers that currently interfere with their wellness or overcoming obstacles.  3. Patient will identify feelings, thought process and behaviors related to these barriers. 4. Patient will identify two changes they are willing to make to overcome these obstacles:    Summary of Patient Progress      Therapeutic Modalities:   Cognitive Behavioral Therapy Solution Focused Therapy Motivational Interviewing Relapse Prevention Therapy  Daleen SquibbGreg Roldan Laforest, LCSW

## 2018-03-08 NOTE — Progress Notes (Signed)
Pt agreed to take Genvoya at this time.

## 2018-03-09 ENCOUNTER — Ambulatory Visit: Payer: Self-pay | Admitting: Infectious Diseases

## 2018-03-09 DIAGNOSIS — F17210 Nicotine dependence, cigarettes, uncomplicated: Secondary | ICD-10-CM

## 2018-03-09 LAB — BASIC METABOLIC PANEL
Anion gap: 8 (ref 5–15)
BUN: 13 mg/dL (ref 6–20)
CO2: 25 mmol/L (ref 22–32)
CREATININE: 0.8 mg/dL (ref 0.44–1.00)
Calcium: 9.2 mg/dL (ref 8.9–10.3)
Chloride: 106 mmol/L (ref 101–111)
GFR calc Af Amer: >60 mL/min (ref >60)
GFR calc non Af Amer: >60 mL/min (ref >60)
GLUCOSE: 96 mg/dL (ref 65–99)
POTASSIUM: 3.7 mmol/L (ref 3.5–5.1)
Sodium: 139 mmol/L (ref 135–145)

## 2018-03-09 MED ORDER — OLANZAPINE 5 MG PO TBDP: 5.0000 mg | ORAL_TABLET | Freq: Three times a day (TID) | ORAL | Status: DC | PRN

## 2018-03-09 MED ORDER — HYDROXYZINE HCL 25 MG PO TABS
25.0000 mg | ORAL_TABLET | Freq: Three times a day (TID) | ORAL | Status: DC | PRN
  Administered 2018-03-09 – 2018-03-11 (×3): 25 mg via ORAL
  Filled 2018-03-09: qty 10
  Filled 2018-03-09 (×3): qty 1

## 2018-03-09 MED ORDER — LORAZEPAM 1 MG PO TABS: 1.0000 mg | ORAL_TABLET | Freq: Four times a day (QID) | ORAL | Status: AC | PRN

## 2018-03-09 NOTE — Progress Notes (Signed)
[facility] MD Progress Note  03/09/2018 11:36 AM Sarah Watkins  MRN:  [id number] Subjective: Patient is seen and examined.  Patient is a 37 year old female with a past psychiatric history significant for major depression.  She is seen in follow-up.  She had an episode of irritability last night that required her to be placed on one-to-one.  She stated the reason that occurred was that she suddenly was very frustrated over being in the hospital.  She wanted to be released and be able to go home and see her family.  She stated today she is not feeling that way.  She stated she was able to sleep without medication last night.  She stated her mood is doing better.  She stated that she thought the Zoloft was working better than the Prozac did.  She denied any other side effects to the medication.  She denied any suicidal ideation.  She denied any alcohol withdrawal symptoms.  We discussed her HIV medications. Principal Problem: <principal problem not specified> Diagnosis:   Patient Active Problem List   Diagnosis Date Noted  . MDD (major depressive disorder), severe ([place]) [F32.2] 03/07/2018  . Major depressive disorder, recurrent episode, severe ([place]) [F33.2] 03/06/2018  . Essential hypertension [I10] 04/29/2017  . Hepatitis B immune [Z78.9] 07/28/2016  . Influenza-like illness [R69] 11/09/2013  . HIV disease ([place]) [B20] 10/05/2013  . Dyslipidemia [E78.5] 10/05/2013  . Morbid obesity ([place]) [E66.01] 10/05/2013  . Cigarette smoker [F17.210] 10/05/2013  . Adjustment disorder with mixed anxiety and depressed mood [F43.23] 08/31/2013   Total Time spent with patient: 20 minutes  Past Psychiatric History:   Past Medical History:  Past Medical History:  Diagnosis Date  . Hepatitis B immune 07/28/2016  . HIV (human immunodeficiency virus infection) ([place]) 01/2013  . HTN (hypertension)   . Obesity (BMI 30.0-34.9)     Past Surgical History:  Procedure Laterality Date  . CESAREAN SECTION     Family  History:  Family History  Problem Relation Age of Onset  . Hypertension Mother    Family Psychiatric  History: See admission H&P Social History:  Social History   Substance and Sexual Activity  Alcohol Use Yes   Comment: occ, social, less than weekly     Social History   Substance and Sexual Activity  Drug Use Yes  . Frequency: 7.0 times per week  . Types: Marijuana   Comment: marijuana/ rare    Social History   Socioeconomic History  . Marital status: Single    Spouse name: Not on file  . Number of children: Not on file  . Years of education: Not on file  . Highest education level: Not on file  Occupational History  . Not on file  Social Needs  . Financial resource strain: Not on file  . Food insecurity:    Worry: Not on file    Inability: Not on file  . Transportation needs:    Medical: Not on file    Non-medical: Not on file  Tobacco Use  . Smoking status: Current Every Day Smoker    Packs/day: 0.30    Types: Cigarettes  . Smokeless tobacco: Never Used  Substance and Sexual Activity  . Alcohol use: Yes    Comment: occ, social, less than weekly  . Drug use: Yes    Frequency: 7.0 times per week    Types: Marijuana    Comment: marijuana/ rare  . Sexual activity: Not Currently    Partners: Male    Birth control/protection:  IUD    Comment: pt given condoms  Lifestyle  . Physical activity:    Days per week: Not on file    Minutes per session: Not on file  . Stress: Not on file  Relationships  . Social connections:    Talks on phone: Not on file    Gets together: Not on file    Attends religious service: Not on file    Active member of club or organization: Not on file    Attends meetings of clubs or organizations: Not on file    Relationship status: Not on file  Other Topics Concern  . Not on file  Social History Narrative  . Not on file   Additional Social History:                         Sleep: Good  Appetite:  Good  Current  Medications: Current Facility-Administered Medications  Medication Dose Route Frequency Provider Last Rate Last Dose  . acetaminophen (TYLENOL) tablet 650 mg  650 mg Oral Q6H PRN Money, Lowry Ram, FNP   650 mg at 03/07/18 1907  . alum & mag hydroxide-simeth (MAALOX/MYLANTA) 200-200-20 MG/5ML suspension 30 mL  30 mL Oral Q4H PRN Money, Lowry Ram, FNP      . elvitegravir-cobicistat-emtricitabine-tenofovir (GENVOYA) 150-150-200-10 MG tablet 1 tablet  1 tablet Oral Q breakfast Cobos, Myer Peer, MD   1 tablet at 03/09/18 0742  . hydrochlorothiazide (HYDRODIURIL) tablet 25 mg  25 mg Oral Daily Cobos, Myer Peer, MD   25 mg at 03/09/18 0742  . hydrOXYzine (ATARAX/VISTARIL) tablet 25 mg  25 mg Oral TID PRN Money, Lowry Ram, FNP   25 mg at 03/07/18 2116  . loperamide (IMODIUM) capsule 2-4 mg  2-4 mg Oral PRN Derrill Center, NP      . LORazepam (ATIVAN) tablet 1 mg  1 mg Oral Q6H PRN Cobos, Myer Peer, MD   1 mg at 03/08/18 1437  . magnesium hydroxide (MILK OF MAGNESIA) suspension 30 mL  30 mL Oral Daily PRN Money, Lowry Ram, FNP      . multivitamin with minerals tablet 1 tablet  1 tablet Oral Daily Derrill Center, NP   1 tablet at 03/09/18 0742  . OLANZapine zydis (ZYPREXA) disintegrating tablet 5 mg  5 mg Oral Q8H PRN Cobos, Myer Peer, MD   5 mg at 03/08/18 1437  . ondansetron (ZOFRAN-ODT) disintegrating tablet 4 mg  4 mg Oral Q6H PRN Derrill Center, NP      . sertraline (ZOLOFT) tablet 50 mg  50 mg Oral Daily Cobos, Myer Peer, MD   50 mg at 03/09/18 0744  . thiamine (VITAMIN B-1) tablet 100 mg  100 mg Oral Daily Derrill Center, NP   100 mg at 03/09/18 0743  . traZODone (DESYREL) tablet 50 mg  50 mg Oral QHS PRN Money, Lowry Ram, FNP   50 mg at 03/07/18 2116    Lab Results:  Results for orders placed or performed during the hospital encounter of 03/07/18 (from the past 48 hour(s))  TSH     Status: None   Collection Time: 03/07/18 12:25 PM  Result Value Ref Range   TSH 0.470 0.350 - 4.500 uIU/mL     Comment: Performed by a 3rd Generation assay with a functional sensitivity of <=0.01 uIU/mL. Performed at [facility], [place] [address], [place],  [address]   Basic metabolic panel     Status: Abnormal  Collection Time: 03/07/18  6:41 PM  Result Value Ref Range   Sodium 139 135 - 145 mmol/L   Potassium 3.1 (L) 3.5 - 5.1 mmol/L   Chloride 107 101 - 111 mmol/L   CO2 24 22 - 32 mmol/L   Glucose, Bld 95 65 - 99 mg/dL   BUN 18 6 - 20 mg/dL   Creatinine, Ser 0.81 0.44 - 1.00 mg/dL   Calcium 9.0 8.9 - 10.3 mg/dL   GFR calc non Af Amer >60 >60 mL/min   GFR calc Af Amer >60 >60 mL/min    Comment: (NOTE) The eGFR has been calculated using the CKD EPI equation. This calculation has not been validated in all clinical situations. eGFR's persistently <60 mL/min signify possible Chronic Kidney Disease.    Anion gap 8 5 - 15    Comment: Performed at [facility], [place] [address], [place], [place] [address]  Rapid urine drug screen (hospital performed)     Status: Abnormal   Collection Time: 03/07/18  9:45 PM  Result Value Ref Range   Opiates NONE DETECTED NONE DETECTED   Cocaine NONE DETECTED NONE DETECTED   Benzodiazepines POSITIVE (A) NONE DETECTED   Amphetamines NONE DETECTED NONE DETECTED   Tetrahydrocannabinol POSITIVE (A) NONE DETECTED   Barbiturates NONE DETECTED NONE DETECTED    Comment: (NOTE) DRUG SCREEN FOR MEDICAL PURPOSES ONLY.  IF CONFIRMATION IS NEEDED FOR ANY PURPOSE, NOTIFY LAB WITHIN 5 DAYS. LOWEST DETECTABLE LIMITS FOR URINE DRUG SCREEN Drug Class                     Cutoff (ng/mL) Amphetamine and metabolites    1000 Barbiturate and metabolites    200 Benzodiazepine                 164 Tricyclics and metabolites     300 Opiates and metabolites        300 Cocaine and metabolites        300 THC                            50 Performed at [facility], [place] [address] [place], [place] [address]    Pregnancy, urine     Status: None   Collection Time: 03/07/18  9:45 PM  Result Value Ref Range   Preg Test, Ur NEGATIVE NEGATIVE    Comment:        THE SENSITIVITY OF THIS METHODOLOGY IS >20 mIU/mL. Performed at [facility], [place] [address], [place], [place] [address]   Basic metabolic panel     Status: None   Collection Time: 03/09/18  6:45 AM  Result Value Ref Range   Sodium 139 135 - 145 mmol/L   Potassium 3.7 3.5 - 5.1 mmol/L   Chloride 106 101 - 111 mmol/L   CO2 25 22 - 32 mmol/L   Glucose, Bld 96 65 - 99 mg/dL   BUN 13 6 - 20 mg/dL   Creatinine, Ser 0.80 0.44 - 1.00 mg/dL   Calcium 9.2 8.9 - 10.3 mg/dL   GFR calc non Af Amer >60 >60 mL/min   GFR calc Af Amer >60 >60 mL/min    Comment: (NOTE) The eGFR has been calculated using the CKD EPI equation. This calculation has not been validated in all clinical situations. eGFR's persistently <60 mL/min signify possible Chronic Kidney Disease.    Anion gap 8 5 - 15  Comment: Performed at [facility], [place] [address], [place], [place] [address]    Blood Alcohol level:  Lab Results  Component Value Date   ETH 192 (H) 03/05/2018   ETH <11 83/15/1761    Metabolic Disorder Labs: Lab Results  Component Value Date   HGBA1C 5.3 04/29/2017   MPG 105 04/29/2017   No results found for: PROLACTIN Lab Results  Component Value Date   CHOL 243 (H) 12/07/2017   TRIG 112 12/07/2017   HDL 36 (L) 12/07/2017   CHOLHDL 6.8 (H) 12/07/2017   VLDL 26 04/29/2017   LDLCALC 183 (H) 12/07/2017   LDLCALC 168 (H) 04/29/2017    Physical Findings: AIMS:  , ,  ,  ,    CIWA:  CIWA-Ar Total: 0 COWS:     Musculoskeletal: Strength & Muscle Tone: within normal limits Gait & Station: normal Patient leans: N/A  Psychiatric Specialty Exam: Physical Exam  Nursing note and vitals reviewed. Constitutional: She is oriented to person, place, and time. She appears well-developed and well-nourished.   HENT:  Head: Normocephalic and atraumatic.  Respiratory: Effort normal.  Musculoskeletal: Normal range of motion.  Neurological: She is alert and oriented to person, place, and time.    ROS  Blood pressure 113/78, pulse 87, temperature 98.6 F (37 C), temperature source Oral, resp. rate 18, height _0  (1.626 m), weight 100.2 kg (221 lb), last menstrual period 03/05/2018, SpO2 100 %.Body mass index is 37.93 kg/m.  General Appearance: Casual  Eye Contact:  Good  Speech:  Normal Rate  Volume:  Normal  Mood:  Anxious  Affect:  Congruent  Thought Process:  Coherent  Orientation:  Full (Time, Place, and Person)  Thought Content:  Logical  Suicidal Thoughts:  No  Homicidal Thoughts:  No  Memory:  Immediate;   Good  Judgement:  Fair  Insight:  Fair  Psychomotor Activity:  Normal  Concentration:  Concentration: Fair  Recall:  [place] of Knowledge:  Good  Language:  Good  Akathisia:  No  Handed:  Right  AIMS (if indicated):     Assets:  Communication Skills Desire for Improvement Social Support  ADL's:  Intact  Cognition:  WNL  Sleep:  Number of Hours: 6.75     Treatment Plan Summary: Daily contact with patient to assess and evaluate symptoms and progress in treatment, Medication management and Plan Patient is seen and examined.  Patient is a 37 year old female with the past psychiatric history significant for major depression who was seen in follow-up.  She seems to be slowly improving.  She did have an episode of irritability last night, but that is resolved.  She continues on sertraline, thiamine, lorazepam, trazodone.  She did not have to take the trazodone last night she slept well.  She denied any suicidal ideation today.  We will continue to monitor her progress.  I am not can change any of her medications today.  We will continue to monitor for any irritability that requires intervention.  She continues on her HIV medications, and denied any side effect of them.  She  denied any recent withdrawal symptoms from alcohol.  She will continue on her current medications, integrates in the milieu, attend groups, and receives support from nursing and social work Biochemist, clinical.  Sharma Covert, MD 03/09/2018, 11:36 AM

## 2018-03-09 NOTE — Progress Notes (Signed)
1:1 Note Discontinued  0910  1:1 Discontinued.  Patient happy, smiling.  Patient denied SI and HI, contracts for safety.  Denied A/V hallucinations.  Denied pain.  Respirations even and unlabored.  No signs/symptoms of pain/distress noted on patient's face/body movements.  Safety maintained with 15 minute checks.

## 2018-03-09 NOTE — Progress Notes (Signed)
1:1 Discontinued 1900 Note   Patient sitting in dayroom talking to peers.  Patient went to dinner in dining room.  Patient stated "I feel wonderful."  Respirations even and unlabored.  No signs/symptoms of pain/distress noted on patient's face/body movements.  Safety maintained with 15 minute checks.

## 2018-03-09 NOTE — Progress Notes (Signed)
1:1 Discontinued Note 1100  Patient denied SI and HI, contracts for safety.  Denied A/V hallucinations.  Denied pain.  Patient has been smiling, happy, talking to peers and staff.  Stated she is happy to no longer be on 1:1.  Respirations even and unlabored.  No signs/symptoms of pain/distress noted on patient's face/body movements.  Safety maintained with 15 minute checks.

## 2018-03-09 NOTE — Progress Notes (Signed)
D:  Patient's self inventory sheet, patient sleeps good, no sleep medication.  Good appetite, normal energy level, good concentration.  Rated depression and anxiety #2.  Denied hopeless.  Denied withdrawals.  Denied SI.  Denied physical problems.  Denied physical pain.  Goal is get better to discharge and see son.  Plans to attend groups, take meds.  "Sorry for the outburst yesterday!"  A:  Medications administered per MD orders.  Emotional support and encouragement given patient. R:  Patient denied SI and HI, contracts for safety.  Denied A/V hallucinations.  Safety maintained with 15 minute checks.

## 2018-03-09 NOTE — Progress Notes (Signed)
1:1 Note 0755  Patient continues to smile, happy.  Patient took all of her morning medications.  Refused any prn medications.  1:1 continues per MD order for safety.

## 2018-03-09 NOTE — [facility] Group Notes (Signed)
[facility] Group Notes:  (Nursing/MHT/Case Management/Adjunct)  Date:  03/09/2018  Time:  3:45 pm  Type of Therapy:  Nurse Education  Participation Level:  Active  Participation Quality:  Appropriate  Affect:  Appropriate  Cognitive:  Appropriate  Insight:  Appropriate  Engagement in Group:  Engaged  Modes of Intervention:  Education  Summary of Progress/Problems: Patient was alert and participated in group.       Earline MayotteKnight, Rozelle Caudle Shephard 03/09/2018, 5:53 PM

## 2018-03-09 NOTE — Progress Notes (Signed)
Recreation Therapy Notes  Animal-Assisted Activity (AAA) Program Checklist/Progress Notes Patient Eligibility Criteria Checklist & Daily Group note for Rec Tx Intervention  Date: 4.9.19 Time: 2:45 p.m.  [facility]   AAA/T Program Assumption of Risk Form signed by Patient/ or Parent Legal Guardian Yes  Patient is free of allergies or sever asthma Yes  Patient reports no fear of animals Yes  Patient reports no history of cruelty to animals Yes  Patient understands his/her participation is voluntary Yes  Patient washes hands before animal contact Yes  Patient washes hands after animal contact Yes  Behavioral Response: Appropriate   Education: Hand Washing, Appropriate Animal Interaction   Education Outcome: Acknowledges education.   Clinical Observations/Feedback: Patient attended session and interacted appropriately with therapy dog and peers. Patient asked appropriate questions about therapy dog and his training.   Sheryle Hailarian Major [place], Recreation Therapy Intern  Sheryle HailDarian Baelynn Schmuhl 03/09/2018 2:03 PM

## 2018-03-09 NOTE — Progress Notes (Signed)
The patient shared in group that she had a much better day as compared to yesterday. She states that she remained in the dayroom for much of the day and did not have any outbursts. She credited her positive day to having received a lot of help from her peers. Her goal for tomorrow is to "go home".

## 2018-03-09 NOTE — Plan of Care (Signed)
Nurse discussed depression, anxiety, coping skills with patient.  

## 2018-03-09 NOTE — [facility] Suicide Risk Assessment (Signed)
[facility] INPATIENT:  Family/Significant Other Suicide Prevention Education  Suicide Prevention Education:  Education Completed;  Sarah Watkins, mother, [phone] been identified by the patient as the family member/significant other with whom the patient will be residing, and identified as the person(s) who will aid the patient in the event of a mental health crisis (suicidal ideations/suicide attempt).  With written consent from the patient, the family member/significant other has been provided the following suicide prevention education, prior to the and/or following the discharge of the patient.  The suicide prevention education provided includes the following:  Suicide risk factors  Suicide prevention and interventions  National Suicide Hotline telephone number  [facility] assessment telephone number  [facility] Emergency Assistance 911  [facility] and/or Residential Mobile Crisis Unit telephone number  Request made of family/significant other to:  Remove weapons (e.g., guns, rifles, knives), all items previously/currently identified as safety concern.  No guns in the home, per [place].  Remove drugs/medications (over-the-counter, prescriptions, illicit drugs), all items previously/currently identified as a safety concern.  The family member/significant other verbalizes understanding of the suicide prevention education information provided.  The family member/significant other agrees to remove the items of safety concern listed above.  Mother reports pt has a lot going on: school, parenting.  When she gets stressed, "the least little thing can set her off."  Mother spoke to MD, is glad meds are being used and will be continue to be supportive for pt moving forward.  Lorri FrederickWierda, Lori Liew Jon, LCSW 03/09/2018, 2:55 PM

## 2018-03-09 NOTE — [facility] Group Notes (Signed)
  [facility] Therapy [facility]  Date/Time: 03/09/18, 1315  Type of Therapy/Topic:  Group Therapy:  Emotion Regulation  Participation Level:  Active   Mood:pleasant  Description of Group:    The purpose of this group is to assist patients in learning to regulate negative emotions and experience positive emotions. Patients will be guided to discuss ways in which they have been vulnerable to their negative emotions. These vulnerabilities will be juxtaposed with experiences of positive emotions or situations, and patients challenged to use positive emotions to combat negative ones. Special emphasis will be placed on coping with negative emotions in conflict situations, and patients will process healthy conflict resolution skills.  Therapeutic Goals: 1. Patient will identify two positive emotions or experiences to reflect on in order to balance out negative emotions:  2. Patient will label two or more emotions that they find the most difficult to experience:  3. Patient will be able to demonstrate positive conflict resolution skills through discussion or role plays:   Summary of Patient Progress:Pt identified "aggravated" as an emotion that is difficult for her to experience.  Pt did not participate much in the group discussion regarding positive ways to handle difficult emotions but was attentive and made one comment.         Therapeutic Modalities:   Cognitive Behavioral Therapy Feelings Identification Dialectical Behavioral Therapy  Daleen SquibbGreg Ronell Duffus, LCSW

## 2018-03-09 NOTE — Progress Notes (Signed)
Patient ID: Kenna GilbertDemetria S Watkins, female   DOB: May 27, 1981, 37 y.o.   MRN: [id number] 1:1 Note  Pt at this time is in bed resting with eyes closed. Pt does not look to be in any distress at this time. 1:1 staff is present in room with Pt at this time. 1:1 monitoring continues for Pt's safety. 15-minute safety checks also continues at this time.

## 2018-03-09 NOTE — Progress Notes (Signed)
1:1 Note 0730  Patient in room making her bed.  Patient denied SI and HI.  Denied A/V hallucinations.  Denied pain.  Respirations even and unlabored.  No signs/symptoms of pain/distress noted on patient's face/body movements.  Patient smiling, happy.  1:1 continues for safety per MD orders.

## 2018-03-09 NOTE — Progress Notes (Signed)
1:1 Discontinued 1700 Note  Patient has been to groups, talking to peers and staff.  Respirations even and unlabored.  No signs/symptoms of pain/distress noted on patient's face/body movements.  Safety maintained with 15 minute checks.

## 2018-03-09 NOTE — Progress Notes (Signed)
1:! Discontinued Note 1300  Patient went to dining room for lunch.  Patient denied SI and HI, contracts for safety.  Denied A/V hallucinations.  Safety maintained with 15 minute checks.  Respirations even and unlabored.  No signs/symptoms of pain/distress noted on patient's face/body movements.

## 2018-03-09 NOTE — [facility] Group Notes (Signed)
Pt participated in recreation activity outside with other peers. 

## 2018-03-09 NOTE — Progress Notes (Signed)
Patient ID: Sarah Watkins, female   DOB: 11/16/1981, 36 y.o.   MRN: [id number] 1:1 Note  Pt at this time is in bed resting with eyes closed. Pt does not look to be in any distress at this time. 1:1 staff is present in room with Pt at this time. 1:1 monitoring continues for Pt's safety. 15-minute safety checks also continues at this time. 

## 2018-03-09 NOTE — [facility] Group Notes (Signed)
Pt attended spiritual care group on grief and loss facilitated by chaplain Burnis KingfisherMatthew Kahleb Mcclane   Group opened with brief discussion and psycho-social ed around grief and loss in relationships and in relation to self - identifying life patterns, circumstances, changes that cause losses. Established group norms of support for self and others, and of speaking from own life experience. Group goal of establishing open and affirming space for members to share understanding of loss and experience with grief, normalize grief experience and provide psycho social education and grief support.  Group reflected on Worden's four tasks of grief and connected understanding of their own grief journey.   Sarah Watkins was present throughout group.  Engaged in group discussion voluntarily.  Noted that she had lost touch with herself over several life transitions.  She is a mother to a 39 year old, recently moved from [place] and currently lives with her parents, whom she spends some time caring for.  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  Related she did not recognize how isolated she had become until she found herself overwhelmed.  Spoke with group about how the roles we play in families and expectations we have for ourself affect grieving - especially around self-care.  [facility], but has been feeling better, as she realizes she needs to take time to care for herself.    [facility] / [facility] Chaplain Burnis KingfisherMatthew Leonor Darnell, MDiv, [facility]

## 2018-03-09 NOTE — Progress Notes (Signed)
1:1 Note discontinued 1500  Patient has been attending groups, interacting with peers and staff.  Patient denied SI and HI, contracts for safety.  Denied A/V hallucinations.  Denied pain.  Respirations even and unlabored.  No signs/symptoms of pain/distress noted on patient's face/body movements.  Safety maintained with 15 minute checks.

## 2018-03-09 NOTE — [facility] Group Notes (Signed)
Adult Psychoeducational Group Note  Date:  03/09/2018 Time:  9:43 AM  Group Topic/Focus:  Goals Group:   The focus of this group is to help patients establish daily goals to achieve during treatment and discuss how the patient can incorporate goal setting into their daily lives to aide in recovery.  Participation Level:  Active  Participation Quality:  Appropriate  Affect:  Appropriate  Cognitive:  Appropriate  Insight: Appropriate  Engagement in Group:  Engaged  Modes of Intervention:  Orientation  Additional Comments:  Pt attended goals/orientation group. Pt goal for today is to go home and get back to school.   [name] 03/09/2018, 9:43 AM

## 2018-03-10 DIAGNOSIS — G4700 Insomnia, unspecified: Secondary | ICD-10-CM

## 2018-03-10 DIAGNOSIS — Z975 Presence of (intrauterine) contraceptive device: Secondary | ICD-10-CM

## 2018-03-10 MED ORDER — OLANZAPINE 10 MG PO TABS
10.0000 mg | ORAL_TABLET | Freq: Every day | ORAL | Status: DC
  Administered 2018-03-10 – 2018-03-11 (×2): 10 mg via ORAL
  Filled 2018-03-10 (×3): qty 1

## 2018-03-10 NOTE — [facility] Group Notes (Signed)
[facility] Group Therapy  03/10/2018  ? Type of Therapy: Mental Health Association Presentation ? Participation Level: Active ? Participation Quality: Attentive ? Affect: Appropriate ? Cognitive: Oriented ? Insight: Developing/Improving ? Engagement in Therapy: Engaged ? Modes of Intervention: Discussion, Education and Socialization ? Summary of Progress/Problems: Mental Health Association (MHA) Speaker came to talk about his personal journey with mental health. The pt processed ways by which to relate to the speaker. MHA speaker provided handouts and educational information pertaining to groups and services offered by the MHA. Pt was engaged in speaker's presentation and was receptive to resources provided.              

## 2018-03-10 NOTE — Progress Notes (Signed)
Adult Psychoeducational Group Note  Date:  03/10/2018 Time:  9:43 PM  Group Topic/Focus:  Wrap-Up Group:   The focus of this group is to help patients review their daily goal of treatment and discuss progress on daily workbooks.  Participation Level:  Active  Participation Quality:  Appropriate  Affect:  Appropriate  Cognitive:  Alert and Oriented  Insight: Good  Engagement in Group:  Engaged  Modes of Intervention:  Discussion  Additional Comments:  Pt attended group this evening. Pt rated her day a 9/10.  Leo GrosserMegan A Loye Reininger 03/10/2018, 9:43 PM

## 2018-03-10 NOTE — Progress Notes (Signed)
[facility] MD Progress Note  03/10/2018 2:41 PM Sarah Watkins  MRN:  [id number]   Subjective: Patient is a 37 year old female with a past psychiatric history significant for major depression.  Patient reports today that she had disrupted sleep last night.  She reports that her agitation has increased as well today.  She states that in group this morning she could not stand to listen to the person to talk anymore so she had to get up and leave twice.  She also reports becoming agitated with her mother on the phone today, even though her mother is her best friend and she talked to her mom about everything.  "I told my mom I decided to get off the phone."  Patient still agrees that the Zoloft has assisted in improving her mood, however her mood has decreased today.  Patient denies any SI/HI/AVH.  Patient does ask about discharging soon, but the patient is willing to stay to ensure stability.  Objective: Patient's chart and findings reviewed and discussed with treatment team.  Patient presents in the day room attending group.  Patient is pleasant and cooperative.  Patient has congruent affect and engages in conversation.  Due to continued agitation we will start Zyprexa 10 mg nightly to assist with sleep as well as agitation.  We will continue all other medications as prescribed.   Principal Problem: MDD (major depressive disorder), severe ([place]) Diagnosis:   Patient Active Problem List   Diagnosis Date Noted  . MDD (major depressive disorder), severe ([place]) [F32.2] 03/07/2018  . Major depressive disorder, recurrent episode, severe ([place]) [F33.2] 03/06/2018  . Essential hypertension [I10] 04/29/2017  . Hepatitis B immune [Z78.9] 07/28/2016  . Influenza-like illness [R69] 11/09/2013  . HIV disease ([place]) [B20] 10/05/2013  . Dyslipidemia [E78.5] 10/05/2013  . Morbid obesity ([place]) [E66.01] 10/05/2013  . Cigarette smoker [F17.210] 10/05/2013  . Adjustment disorder with mixed anxiety and depressed mood  [F43.23] 08/31/2013   Total Time spent with patient: 30 minutes  Past Psychiatric History: See H&P  Past Medical History:  Past Medical History:  Diagnosis Date  . Hepatitis B immune 07/28/2016  . HIV (human immunodeficiency virus infection) ([place]) 01/2013  . HTN (hypertension)   . Obesity (BMI 30.0-34.9)     Past Surgical History:  Procedure Laterality Date  . CESAREAN SECTION     Family History:  Family History  Problem Relation Age of Onset  . Hypertension Mother    Family Psychiatric  History: See H&P Social History:  Social History   Substance and Sexual Activity  Alcohol Use Yes   Comment: occ, social, less than weekly     Social History   Substance and Sexual Activity  Drug Use Yes  . Frequency: 7.0 times per week  . Types: Marijuana   Comment: marijuana/ rare    Social History   Socioeconomic History  . Marital status: Single    Spouse name: Not on file  . Number of children: Not on file  . Years of education: Not on file  . Highest education level: Not on file  Occupational History  . Not on file  Social Needs  . Financial resource strain: Not on file  . Food insecurity:    Worry: Not on file    Inability: Not on file  . Transportation needs:    Medical: Not on file    Non-medical: Not on file  Tobacco Use  . Smoking status: Current Every Day Smoker    Packs/day: 0.30    Types:  Cigarettes  . Smokeless tobacco: Never Used  Substance and Sexual Activity  . Alcohol use: Yes    Comment: occ, social, less than weekly  . Drug use: Yes    Frequency: 7.0 times per week    Types: Marijuana    Comment: marijuana/ rare  . Sexual activity: Not Currently    Partners: Male    Birth control/protection: IUD    Comment: pt given condoms  Lifestyle  . Physical activity:    Days per week: Not on file    Minutes per session: Not on file  . Stress: Not on file  Relationships  . Social connections:    Talks on phone: Not on file    Gets together: Not  on file    Attends religious service: Not on file    Active member of club or organization: Not on file    Attends meetings of clubs or organizations: Not on file    Relationship status: Not on file  Other Topics Concern  . Not on file  Social History Narrative  . Not on file   Additional Social History:                         Sleep: Fair  Appetite:  Good  Current Medications: Current Facility-Administered Medications  Medication Dose Route Frequency Provider Last Rate Last Dose  . acetaminophen (TYLENOL) tablet 650 mg  650 mg Oral Q6H PRN Money, Lowry Ram, FNP   650 mg at 03/07/18 1907  . alum & mag hydroxide-simeth (MAALOX/MYLANTA) 200-200-20 MG/5ML suspension 30 mL  30 mL Oral Q4H PRN Money, Lowry Ram, FNP      . elvitegravir-cobicistat-emtricitabine-tenofovir (GENVOYA) 150-150-200-10 MG tablet 1 tablet  1 tablet Oral Q breakfast Cobos, Myer Peer, MD   1 tablet at 03/10/18 0742  . hydrochlorothiazide (HYDRODIURIL) tablet 25 mg  25 mg Oral Daily Cobos, Myer Peer, MD   25 mg at 03/10/18 0743  . hydrOXYzine (ATARAX/VISTARIL) tablet 25 mg  25 mg Oral TID PRN Cobos, Myer Peer, MD   25 mg at 03/10/18 0956  . magnesium hydroxide (MILK OF MAGNESIA) suspension 30 mL  30 mL Oral Daily PRN Money, Lowry Ram, FNP      . multivitamin with minerals tablet 1 tablet  1 tablet Oral Daily Derrill Center, NP   1 tablet at 03/10/18 0743  . OLANZapine (ZYPREXA) tablet 10 mg  10 mg Oral QHS Money, Lowry Ram, FNP      . OLANZapine zydis (ZYPREXA) disintegrating tablet 5 mg  5 mg Oral Q8H PRN Cobos, Fernando A, MD      . sertraline (ZOLOFT) tablet 50 mg  50 mg Oral Daily Cobos, Myer Peer, MD   50 mg at 03/10/18 0742  . thiamine (VITAMIN B-1) tablet 100 mg  100 mg Oral Daily Derrill Center, NP   100 mg at 03/10/18 0742  . traZODone (DESYREL) tablet 50 mg  50 mg Oral QHS PRN Money, Lowry Ram, FNP   50 mg at 03/09/18 2203    Lab Results:  Results for orders placed or performed during the  hospital encounter of 03/07/18 (from the past 48 hour(s))  Basic metabolic panel     Status: None   Collection Time: 03/09/18  6:45 AM  Result Value Ref Range   Sodium 139 135 - 145 mmol/L   Potassium 3.7 3.5 - 5.1 mmol/L   Chloride 106 101 - 111 mmol/L   CO2 25 22 -  32 mmol/L   Glucose, Bld 96 65 - 99 mg/dL   BUN 13 6 - 20 mg/dL   Creatinine, Ser 0.80 0.44 - 1.00 mg/dL   Calcium 9.2 8.9 - 10.3 mg/dL   GFR calc non Af Amer >60 >60 mL/min   GFR calc Af Amer >60 >60 mL/min    Comment: (NOTE) The eGFR has been calculated using the CKD EPI equation. This calculation has not been validated in all clinical situations. eGFR's persistently <60 mL/min signify possible Chronic Kidney Disease.    Anion gap 8 5 - 15    Comment: Performed at [facility], [place] [address] [place], [place] [address]    Blood Alcohol level:  Lab Results  Component Value Date   ETH 192 (H) 03/05/2018   ETH <11 47/82/9562    Metabolic Disorder Labs: Lab Results  Component Value Date   HGBA1C 5.3 04/29/2017   MPG 105 04/29/2017   No results found for: PROLACTIN Lab Results  Component Value Date   CHOL 243 (H) 12/07/2017   TRIG 112 12/07/2017   HDL 36 (L) 12/07/2017   CHOLHDL 6.8 (H) 12/07/2017   VLDL 26 04/29/2017   LDLCALC 183 (H) 12/07/2017   LDLCALC 168 (H) 04/29/2017    Physical Findings: AIMS: Facial and Oral Movements Muscles of Facial Expression: None, normal Lips and Perioral Area: None, normal Jaw: None, normal Tongue: None, normal,Extremity Movements Upper (arms, wrists, hands, fingers): None, normal Lower (legs, knees, ankles, toes): None, normal, Trunk Movements Neck, shoulders, hips: None, normal, Overall Severity Severity of abnormal movements (highest score from questions above): None, normal Incapacitation due to abnormal movements: None, normal Patient's awareness of abnormal movements (rate only patient's report): No Awareness, Dental Status Current  problems with teeth and/or dentures?: No Does patient usually wear dentures?: No  CIWA:  CIWA-Ar Total: 2 COWS:  COWS Total Score: 2  Musculoskeletal: Strength & Muscle Tone: within normal limits Gait & Station: normal Patient leans: N/A  Psychiatric Specialty Exam: Physical Exam  ROS  Blood pressure 109/82, pulse 70, temperature 98.3 F (36.8 C), temperature source Oral, resp. rate 18, height '5\' 4"'  (1.626 m), weight 100.2 kg (221 lb), last menstrual period 03/05/2018, SpO2 100 %.Body mass index is 37.93 kg/m.  General Appearance: Casual  Eye Contact:  Good  Speech:  Clear and Coherent and Normal Rate  Volume:  Normal  Mood:  Depressed  Affect:  Congruent  Thought Process:  Goal Directed and Descriptions of Associations: Intact  Orientation:  Full (Time, Place, and Person)  Thought Content:  WDL  Suicidal Thoughts:  No  Homicidal Thoughts:  No  Memory:  Immediate;   Good Recent;   Good Remote;   Good  Judgement:  Fair  Insight:  Good  Psychomotor Activity:  Normal  Concentration:  Concentration: Good and Attention Span: Good  Recall:  Good  Fund of Knowledge:  Good  Language:  Good  Akathisia:  No  Handed:  Right  AIMS (if indicated):     Assets:  Communication Skills Desire for Improvement Financial Resources/Insurance Housing Physical Health Social Support Transportation  ADL's:  Intact  Cognition:  WNL  Sleep:  Number of Hours: 6.25   Problems addressed MDD severe  Treatment Plan Summary: Daily contact with patient to assess and evaluate symptoms and progress in treatment, Medication management and Plan is to:  -Start Zyprexa 10 mg p.o. nightly for  mood stability -Continue Zoloft 50 mg daily for mood stability -Continue trazodone 50 mg p.o.  nightly as needed for insomnia -Continue Vistaril 25 mg 3 times daily as needed for anxiety -Continue HIV medications -Encourage group therapy participation  Lewis Shock, FNP 03/10/2018, 2:41 PM

## 2018-03-10 NOTE — Progress Notes (Signed)
Nursing Progress Note [phone]  Data: On initial approach, patient was interacting with peers in the dayroom. Patient presents with pleasant mood and animated affect. Patient took morning medications without incident. Patient is observed attending groups. Patient states goal for today is to "go home and see my son" and "take my medications and do what I am supposed to do". Patient denies SI/HI/AVH or pain. Patient contracts for safety on the unit at this time. Patient completed self-inventory sheet and rate depression, hopelessness, and anxiety 2,0,2 respectively. Patient does endorse occasional anxiety and agitation due to triggers in group but was appropriate when sharing concerns with staff.   Action: Patient educated about and provided medication per provider's orders. Patient safety maintained with q15 min safety checks. Low fall risk precautions in place. Emotional support given. 1:1 interaction and active listening provided. Patient encouraged to attend meals and groups. Labs, vital signs and patient behavior monitored throughout shift. Patient encouraged to work on treatment plan and goals.  Response: Patient remains safe on the unit at this time. Will continue to support and monitor.

## 2018-03-10 NOTE — Progress Notes (Signed)
Patient ID: Sarah GilbertDemetria S Baudoin, female   DOB: [birth year], 37 y.o.   MRN: [id number] DAR Note: Pt observed in the dayroom interacting with peers. Pt at the time of assessment endorsed mild anxiety and depression; "I can really feel any anxiety now but I was earlier. Pt denied pain, SI, HI or AVH. Pt was med compliant. All Pt complains and questions addressed. Will continue to monitor Pt for safety.

## 2018-03-10 NOTE — Progress Notes (Signed)
Patient ID: Sarah GilbertDemetria S Oland, female   DOB: 29-Nov-1981, 37 y.o.   MRN: [id number]  Pt currently presents with a flat affect and cooperative behavior. Pt interacts pleasantly with peers. Pt reports to Clinical research associatewriter that their goal is to "go home tomorrow." Pt states "I am feeling good, much better." Pt reports poor sleep last night with medication regimen. Pt to take a new sleep aid tonight, states "That's why I didn't leave today, he (MD) wants me to sleep well first."  Pt provided with medications per providers orders. Pt's labs and vitals were monitored throughout the night. Pt given a 1:1 about emotional and mental status. Pt supported and encouraged to express concerns and questions. Pt educated on medications and signs and symptoms of allergic reaction.  Pt's safety ensured with 15 minute and environmental checks. Pt currently denies SI/HI and A/V hallucinations. Pt verbally agrees to seek staff if SI/HI or A/VH occurs and to consult with staff before acting on any harmful thoughts. Will continue POC.

## 2018-03-10 NOTE — Plan of Care (Signed)
Problem: Activity: Goal: Interest or engagement in activities will improve Intervention: Patient has been provided a daily scheduled and is encouraged by staff to attend groups. Outcome: Patient is visible on the unit, interactive with peers/staff and attending groups. 03/10/2018 9:48 AM - Progressing by Ferrel Loganollazo, Halie Gass A, RN    Problem: Health Behavior/Discharge Planning: Goal: Compliance with treatment plan for underlying cause of condition will improve Intervention: Patient encouraged to take medications as prescribed and attend groups. Patient encouraged to be active in their recovery. Outcome: Patient is attending groups, taking medications as prescribed and complying with goals of treatment. 03/10/2018 9:48 AM - Progressing by Ferrel Loganollazo, Ziya Coonrod A, RN    Problem: Safety: Goal: Periods of time without injury will increase Intervention: Patient contracts for safety on the unit. Low fall risk precautions in place. Safety monitored with q15 minute checks. Outcome: Patient remains safe on the unit at this time. 03/10/2018 9:48 AM - Progressing by Ferrel Loganollazo, Orpah Hausner A, RN

## 2018-03-10 NOTE — [facility] Group Notes (Signed)
[facility] Group Notes:  (Nursing/MHT/Case Management/Adjunct)  Date:  03/10/2018  Time:  1330  Type of Therapy:  Nurse Education  Participation Level:  Minimal  Participation Quality:  Appropriate and Attentive  Affect:  Flat  Cognitive:  Alert, Appropriate and Oriented  Insight:  Improving  Engagement in Group:  Developing/Improving and Limited  Modes of Intervention:  Discussion, Education, Exploration and Support  Summary of Progress/Problems: pt participated and contributed to the discussion of positive self talk.  Suszanne ConnersMichael R Glendell Fouse 03/10/2018, 4:24 PM

## 2018-03-10 NOTE — Progress Notes (Signed)
Recreation Therapy Notes  Date: 4.10.19 Time: 9:30 a.m. Location: [address] Dayroom   Group Topic: Stress Management   Goal Area(s) Addresses:  Goal 1.1: To reduce stress  -Patient will feel a reduction in stress level  -Patient will learn the importance of stress management  -Patient will participate during stress management group      Intervention: Stress Management  Activity: Meditation- Patients were in a peaceful environment with soft lighting enhancing patients mood. Patients listened to a deep concentration meditation to help decrease stress levels   Education: Stress Management, Discharge Planning.    Education Outcome: Acknowledges edcuation/In group clarification offered/Needs additional education   Clinical Observations/Feedback:: Patient did not attend     Sheryle HailDarian Mekesha Solomon, Recreation Therapy Intern   Sheryle HailDarian Shane Melby 03/10/2018 8:42 AM

## 2018-03-11 NOTE — Progress Notes (Signed)
Patient ID: Sarah GilbertDemetria S Watkins, female   DOB: [birth year], 37 y.o.   MRN: [id number]  D: Patient pleasant and cooperative with care this shift and is noted to interact well with peers in the milieu. Pt with bright affect and states she is happy because she is supposed to be discharged in the morning. A: Encourage staff/peer interaction, medication compliance, and group participation. Administer medications as ordered, maintain Q 15 minute safety checks. R: Pt compliant with medications and attended group session. Pt denies SI at this time and verbally contracts for safety. No signs/symptoms of distress noted.

## 2018-03-11 NOTE — Progress Notes (Signed)
[facility] MD Progress Note  03/11/2018 1:17 PM Sarah Watkins  MRN:  [id number]   Subjective: Patient is a 37 year old female with a past psychiatric history significant for major depression.  Patient reports today that she is feeling much much better.  She reports after starting the Zyprexa that she slept much better and she has felt calmer so far today.  She continues to deny SI/HI/AVH.  She reports a reduction in her depression today.  She reports that it was easier for her to get out of bed today due to her improved sleep and reduced depression.  Objective: Patient's chart and findings reviewed and discussed with treatment team.  Patient presents in the hallway interacting with peers and staff.  She has been appropriate, pleasant, and cooperative.  We will continue using Zyprexa 10 mg nightly to help reduce agitation during the day, improve her depression, and to improve her sleep.  Discussed with patient to stay at least another 24 hours to ensure that the dosing of the Zyprexa as correct for the desired goal.  We will continue all other medications as prescribed.   Principal Problem: MDD (major depressive disorder), severe (HCC) Diagnosis:   Patient Active Problem List   Diagnosis Date Noted  . MDD (major depressive disorder), severe (HCC) [F32.2] 03/07/2018  . Major depressive disorder, recurrent episode, severe (HCC) [F33.2] 03/06/2018  . Essential hypertension [I10] 04/29/2017  . Hepatitis B immune [Z78.9] 07/28/2016  . Influenza-like illness [R69] 11/09/2013  . HIV disease (HCC) [B20] 10/05/2013  . Dyslipidemia [E78.5] 10/05/2013  . Morbid obesity (HCC) [E66.01] 10/05/2013  . Cigarette smoker [F17.210] 10/05/2013  . Adjustment disorder with mixed anxiety and depressed mood [F43.23] 08/31/2013   Total Time spent with patient: 30 minutes  Past Psychiatric History: See H&P  Past Medical History:  Past Medical History:  Diagnosis Date  . Hepatitis B immune 07/28/2016  . HIV (human  immunodeficiency virus infection) (HCC) 01/2013  . HTN (hypertension)   . Obesity (BMI 30.0-34.9)     Past Surgical History:  Procedure Laterality Date  . CESAREAN SECTION     Family History:  Family History  Problem Relation Age of Onset  . Hypertension Mother    Family Psychiatric  History: See H&P Social History:  Social History   Substance and Sexual Activity  Alcohol Use Yes   Comment: occ, social, less than weekly     Social History   Substance and Sexual Activity  Drug Use Yes  . Frequency: 7.0 times per week  . Types: Marijuana   Comment: marijuana/ rare    Social History   Socioeconomic History  . Marital status: Single    Spouse name: Not on file  . Number of children: Not on file  . Years of education: Not on file  . Highest education level: Not on file  Occupational History  . Not on file  Social Needs  . Financial resource strain: Not on file  . Food insecurity:    Worry: Not on file    Inability: Not on file  . Transportation needs:    Medical: Not on file    Non-medical: Not on file  Tobacco Use  . Smoking status: Current Every Day Smoker    Packs/day: 0.30    Types: Cigarettes  . Smokeless tobacco: Never Used  Substance and Sexual Activity  . Alcohol use: Yes    Comment: occ, social, less than weekly  . Drug use: Yes    Frequency: 7.0 times per week  Types: Marijuana    Comment: marijuana/ rare  . Sexual activity: Not Currently    Partners: Male    Birth control/protection: IUD    Comment: pt given condoms  Lifestyle  . Physical activity:    Days per week: Not on file    Minutes per session: Not on file  . Stress: Not on file  Relationships  . Social connections:    Talks on phone: Not on file    Gets together: Not on file    Attends religious service: Not on file    Active member of club or organization: Not on file    Attends meetings of clubs or organizations: Not on file    Relationship status: Not on file  Other Topics  Concern  . Not on file  Social History Narrative  . Not on file   Additional Social History:                         Sleep: Fair  Appetite:  Good  Current Medications: Current Facility-Administered Medications  Medication Dose Route Frequency Provider Last Rate Last Dose  . acetaminophen (TYLENOL) tablet 650 mg  650 mg Oral Q6H PRN Nevea Spiewak, Gerlene Burdockravis B, FNP   650 mg at 03/07/18 1907  . alum & mag hydroxide-simeth (MAALOX/MYLANTA) 200-200-20 MG/5ML suspension 30 mL  30 mL Oral Q4H PRN Jessly Lebeck, Gerlene Burdockravis B, FNP      . elvitegravir-cobicistat-emtricitabine-tenofovir (GENVOYA) 150-150-200-10 MG tablet 1 tablet  1 tablet Oral Q breakfast Cobos, Rockey SituFernando A, MD   1 tablet at 03/11/18 0757  . hydrochlorothiazide (HYDRODIURIL) tablet 25 mg  25 mg Oral Daily Cobos, Rockey SituFernando A, MD   25 mg at 03/11/18 0757  . hydrOXYzine (ATARAX/VISTARIL) tablet 25 mg  25 mg Oral TID PRN Cobos, Rockey SituFernando A, MD   25 mg at 03/10/18 0956  . magnesium hydroxide (MILK OF MAGNESIA) suspension 30 mL  30 mL Oral Daily PRN Diana Armijo, Feliz Beamravis B, FNP   30 mL at 03/10/18 1828  . multivitamin with minerals tablet 1 tablet  1 tablet Oral Daily Oneta RackLewis, Tanika N, NP   1 tablet at 03/11/18 0757  . OLANZapine (ZYPREXA) tablet 10 mg  10 mg Oral QHS Laurianne Floresca, Gerlene Burdockravis B, FNP   10 mg at 03/10/18 2221  . OLANZapine zydis (ZYPREXA) disintegrating tablet 5 mg  5 mg Oral Q8H PRN Cobos, Fernando A, MD      . sertraline (ZOLOFT) tablet 50 mg  50 mg Oral Daily Cobos, Rockey SituFernando A, MD   50 mg at 03/11/18 0757  . thiamine (VITAMIN B-1) tablet 100 mg  100 mg Oral Daily Oneta RackLewis, Tanika N, NP   100 mg at 03/11/18 0757  . traZODone (DESYREL) tablet 50 mg  50 mg Oral QHS PRN Chloris Marcoux, Gerlene Burdockravis B, FNP   50 mg at 03/10/18 2224    Lab Results:  No results found for this or any previous visit (from the past 48 hour(s)).  Blood Alcohol level:  Lab Results  Component Value Date   ETH 192 (H) 03/05/2018   ETH <11 08/30/2013    Metabolic Disorder Labs: Lab Results   Component Value Date   HGBA1C 5.3 04/29/2017   MPG 105 04/29/2017   No results found for: PROLACTIN Lab Results  Component Value Date   CHOL 243 (H) 12/07/2017   TRIG 112 12/07/2017   HDL 36 (L) 12/07/2017   CHOLHDL 6.8 (H) 12/07/2017   VLDL 26 04/29/2017   LDLCALC 183 (H) 12/07/2017  LDLCALC 168 (H) 04/29/2017    Physical Findings: AIMS: Facial and Oral Movements Muscles of Facial Expression: None, normal Lips and Perioral Area: None, normal Jaw: None, normal Tongue: None, normal,Extremity Movements Upper (arms, wrists, hands, fingers): None, normal Lower (legs, knees, ankles, toes): None, normal, Trunk Movements Neck, shoulders, hips: None, normal, Overall Severity Severity of abnormal movements (highest score from questions above): None, normal Incapacitation due to abnormal movements: None, normal Patient's awareness of abnormal movements (rate only patient's report): No Awareness, Dental Status Current problems with teeth and/or dentures?: No Does patient usually wear dentures?: No  CIWA:  CIWA-Ar Total: 2 COWS:  COWS Total Score: 2  Musculoskeletal: Strength & Muscle Tone: within normal limits Gait & Station: normal Patient leans: N/A  Psychiatric Specialty Exam: Physical Exam  Nursing note and vitals reviewed. Constitutional: She is oriented to person, place, and time. She appears well-developed and well-nourished.  Cardiovascular: Normal rate.  Respiratory: Effort normal.  Musculoskeletal: Normal range of motion.  Neurological: She is alert and oriented to person, place, and time.  Skin: Skin is warm.    Review of Systems  Constitutional: Negative.   HENT: Negative.   Eyes: Negative.   Respiratory: Negative.   Cardiovascular: Negative.   Gastrointestinal: Negative.   Genitourinary: Negative.   Musculoskeletal: Negative.   Skin: Negative.   Neurological: Negative.   Endo/Heme/Allergies: Negative.   Psychiatric/Behavioral: Negative.     Blood  pressure 116/69, pulse 66, temperature 98.3 F (36.8 C), temperature source Oral, resp. rate 18, height 5\' 4"  (1.626 m), weight 100.2 kg (221 lb), last menstrual period 03/05/2018, SpO2 99 %.Body mass index is 37.93 kg/m.  General Appearance: Casual  Eye Contact:  Good  Speech:  Clear and Coherent and Normal Rate  Volume:  Normal  Mood:  Euthymic  Affect:  Appropriate  Thought Process:  Goal Directed and Descriptions of Associations: Intact  Orientation:  Full (Time, Place, and Person)  Thought Content:  WDL  Suicidal Thoughts:  No  Homicidal Thoughts:  No  Memory:  Immediate;   Good Recent;   Good Remote;   Good  Judgement:  Fair  Insight:  Good  Psychomotor Activity:  Normal  Concentration:  Concentration: Good and Attention Span: Good  Recall:  Good  Fund of Knowledge:  Good  Language:  Good  Akathisia:  No  Handed:  Right  AIMS (if indicated):     Assets:  Communication Skills Desire for Improvement Financial Resources/Insurance Housing Physical Health Social Support Transportation  ADL's:  Intact  Cognition:  WNL  Sleep:  Number of Hours: 6.25   Problems addressed MDD severe  Treatment Plan Summary: Daily contact with patient to assess and evaluate symptoms and progress in treatment, Medication management and Plan is to:  -Continue Zyprexa 10 mg p.o. nightly for  mood stability -Continue Zoloft 50 mg daily for mood stability -Continue trazodone 50 mg p.o. nightly as needed for insomnia -Continue Vistaril 25 mg 3 times daily as needed for anxiety -Continue HIV medications -Encourage group therapy participation  Maryfrances Bunnell, FNP 03/11/2018, 1:17 PM

## 2018-03-11 NOTE — Progress Notes (Addendum)
Nursing Progress Note [phone]  Data: Patient presents with pleasant mood and is calm and cooperative on the unit. Patient complaint with scheduled medications. Patient reports excellent sleep and a decrease in agitation today. Patient denies SI/HI/AVH or pain. Patient contracts for safety on the unit at this time. Patient is observed attending groups and interacting with peers appropriately. Patient rates her depression, hopelessness, and anxiety a 2,0,2 respectively. Patient reports her goals are to "go home and see my son" and "do what I need to do to safely discharge".  Action: Patient educated about and provided medication per provider's orders. Patient safety maintained with q15 min safety checks. Low fall risk precautions in place. Emotional support given. 1:1 interaction and active listening provided. Patient encouraged to attend meals and groups. Labs, vital signs and patient behavior monitored throughout shift. Patient encouraged to work on treatment plan and goals.  Response: Patient remains safe on the unit at this time. Patient is interacting with peers appropriately on the unit. Will continue to support and monitor.

## 2018-03-11 NOTE — Progress Notes (Signed)
Adult Psychoeducational Group Note  Date:  03/11/2018 Time:  5:18 PM  Group Topic/Focus:  Goals Group:   The focus of this group is to help patients establish daily goals to achieve during treatment and discuss how the patient can incorporate goal setting into their daily lives to aide in recovery.  Participation Level:  Active  Participation Quality:  Appropriate  Affect:  Appropriate  Cognitive:  Alert  Insight: Appropriate and Good  Engagement in Group:  Engaged  Modes of Intervention:  Activity  Additional Comments:  Pt attended group and participated in group activity.  Zyah Gomm R Omeed Osuna 03/11/2018, 5:18 PM

## 2018-03-11 NOTE — [facility] Group Notes (Signed)
LCSW Group Therapy Note  03/11/2018 1:15pm  Type of Therapy/Topic:  Group Therapy:  Balance in Life  Participation Level:  Minimal  Description of Group:    This group will address the concept of balance and how it feels and looks when one is unbalanced. Patients will be encouraged to process areas in their lives that are out of balance and identify reasons for remaining unbalanced. Facilitators will guide patients in utilizing problem-solving interventions to address and correct the stressor making their life unbalanced. Understanding and applying boundaries will be explored and addressed for obtaining and maintaining a balanced life. Patients will be encouraged to explore ways to assertively make their unbalanced needs known to significant others in their lives, using other group members and facilitator for support and feedback.  Therapeutic Goals: 1. Patient will identify two or more emotions or situations they have that consume much of in their lives. 2. Patient will identify signs/triggers that life has become out of balance:  3. Patient will identify two ways to set boundaries in order to achieve balance in their lives:  4. Patient will demonstrate ability to communicate their needs through discussion and/or role plays  Summary of Patient Progress: Patient appropriately participated in group. Patient identified school and work as areas of her life that feel most imbalanced at this time.   Therapeutic Modalities:   Cognitive Behavioral Therapy Solution-Focused Therapy Assertiveness Training  Darreld McleanCharlotte C Vennesa Bastedo, Student-Social Work 03/11/2018 1:08 PM

## 2018-03-11 NOTE — B[facility] Group Notes (Signed)
Adult Psychoeducational Group Note  Date:  03/11/2018 Time:  11:55 PM  Group Topic/Focus:  Wrap-Up Group:   The focus of this group is to help patients review their daily goal of treatment and discuss progress on daily workbooks.  Participation Level:  Active  Participation Quality:  Appropriate and Attentive  Affect:  Appropriate  Cognitive:  Alert and Appropriate  Insight: Appropriate and Good  Engagement in Group:  Engaged  Modes of Intervention:  Discussion and Education  Additional Comments:  Pt attended and participated in wrap up group this evening. Pt day started off rocky due to them not being discharged. Pt goal was to be positive while they were here. A positive for the pt was that their mother came to visit and they are being discharged in the morning.   Sarah NettersOctavia A Latoyia Tecson 03/11/2018, 11:55 PM

## 2018-03-12 MED ORDER — ELVITEG-COBIC-EMTRICIT-TENOFAF 150-150-200-10 MG PO TABS: 1.0000 | ORAL_TABLET | Freq: Every day | ORAL | 0 refills | Status: DC

## 2018-03-12 MED ORDER — HYDROCHLOROTHIAZIDE 25 MG PO TABS: 25.0000 mg | ORAL_TABLET | Freq: Every day | ORAL | 11 refills | Status: DC

## 2018-03-12 MED ORDER — HYDROXYZINE HCL 25 MG PO TABS: 25.0000 mg | ORAL_TABLET | Freq: Three times a day (TID) | ORAL | 0 refills | Status: DC | PRN

## 2018-03-12 MED ORDER — SERTRALINE HCL 50 MG PO TABS: 50.0000 mg | ORAL_TABLET | Freq: Every day | ORAL | 0 refills | Status: DC

## 2018-03-12 MED ORDER — OLANZAPINE 10 MG PO TABS: 10.0000 mg | ORAL_TABLET | Freq: Every day | ORAL | 0 refills | Status: DC

## 2018-03-12 MED ORDER — TRAZODONE HCL 50 MG PO TABS: 50.0000 mg | ORAL_TABLET | Freq: Every evening | ORAL | 0 refills | Status: DC | PRN

## 2018-03-12 NOTE — B[facility] Counselor (Signed)
Writer met with patient to discuss substance use outpatient services or referrals available to the patient. Patient declined services, stating her use of alcohol and marijuana is atypical and is a negative coping mechanism for when she feels severely depressed.  Writer encouraged patient to seek referral from outpatient provider is substance use issues become a problem for the patient again.

## 2018-03-12 NOTE — Tx Team (Signed)
Interdisciplinary Treatment and Diagnostic Plan Update  03/12/2018 Time of Session: 0848 Kenna GilbertDemetria S Shifflet MRN: [id number]  Principal Diagnosis: MDD (major depressive disorder), severe (HCC)  Secondary Diagnoses: Principal Problem:   MDD (major depressive disorder), severe (HCC)   Current Medications:  Current Facility-Administered Medications  Medication Dose Route Frequency Provider Last Rate Last Dose  . acetaminophen (TYLENOL) tablet 650 mg  650 mg Oral Q6H PRN Money, Gerlene Burdockravis B, FNP   650 mg at 03/07/18 1907  . alum & mag hydroxide-simeth (MAALOX/MYLANTA) 200-200-20 MG/5ML suspension 30 mL  30 mL Oral Q4H PRN Money, Gerlene Burdockravis B, FNP      . elvitegravir-cobicistat-emtricitabine-tenofovir (GENVOYA) 150-150-200-10 MG tablet 1 tablet  1 tablet Oral Q breakfast Cobos, Rockey SituFernando A, MD   1 tablet at 03/12/18 0824  . hydrochlorothiazide (HYDRODIURIL) tablet 25 mg  25 mg Oral Daily Cobos, Rockey SituFernando A, MD   25 mg at 03/12/18 0824  . hydrOXYzine (ATARAX/VISTARIL) tablet 25 mg  25 mg Oral TID PRN Cobos, Rockey SituFernando A, MD   25 mg at 03/11/18 2137  . magnesium hydroxide (MILK OF MAGNESIA) suspension 30 mL  30 mL Oral Daily PRN Money, Feliz Beamravis B, FNP   30 mL at 03/10/18 1828  . multivitamin with minerals tablet 1 tablet  1 tablet Oral Daily Oneta RackLewis, Tanika N, NP   1 tablet at 03/12/18 0825  . OLANZapine (ZYPREXA) tablet 10 mg  10 mg Oral QHS Money, Gerlene Burdockravis B, FNP   10 mg at 03/11/18 2059  . OLANZapine zydis (ZYPREXA) disintegrating tablet 5 mg  5 mg Oral Q8H PRN Cobos, Fernando A, MD      . sertraline (ZOLOFT) tablet 50 mg  50 mg Oral Daily Cobos, Rockey SituFernando A, MD   50 mg at 03/12/18 0825  . thiamine (VITAMIN B-1) tablet 100 mg  100 mg Oral Daily Oneta RackLewis, Tanika N, NP   100 mg at 03/12/18 0824  . traZODone (DESYREL) tablet 50 mg  50 mg Oral QHS PRN Money, Gerlene Burdockravis B, FNP   50 mg at 03/11/18 2137   PTA Medications: Medications Prior to Admission  Medication Sig Dispense Refill Last Dose  .  elvitegravir-cobicistat-emtricitabine-tenofovir (GENVOYA) 150-150-200-10 MG TABS tablet Take 1 tablet by mouth daily with breakfast. 90 tablet 3 03/05/2018 at Unknown time  . FLUoxetine (PROZAC) 10 MG capsule TAKE 1 CAPSULE(10 MG) BY MOUTH DAILY 30 capsule 2 03/05/2018 at Unknown time  . hydrochlorothiazide (HYDRODIURIL) 25 MG tablet Take 1 tablet (25 mg total) by mouth daily. 30 tablet 11 03/05/2018 at Unknown time  . levonorgestrel (MIRENA) 20 MCG/24HR IUD 1 each by Intrauterine route once.   ongoing at unknown time  . ondansetron (ZOFRAN) 4 MG tablet Take 1 tablet (4 mg total) by mouth every 8 (eight) hours as needed for nausea or vomiting. 20 tablet 3 unknown  . potassium chloride 20 MEQ TBCR Take 20 mEq by mouth daily. 30 tablet 0     Patient Stressors: Financial difficulties Health problems Other: Mental health  Patient Strengths: Ability for insight Average or above average intelligence Communication skills Supportive family/friends  Treatment Modalities: Medication Management, Group therapy, Case management,  1 to 1 session with clinician, Psychoeducation, Recreational therapy.   Physician Treatment Plan for Primary Diagnosis: MDD (major depressive disorder), severe (HCC) Long Term Goal(s): Improvement in symptoms so as ready for discharge Improvement in symptoms so as ready for discharge   Short Term Goals: Ability to identify changes in lifestyle to reduce recurrence of condition will improve Ability to disclose and discuss suicidal ideas  Ability to demonstrate self-control will improve Ability to identify and develop effective coping behaviors will improve Ability to identify changes in lifestyle to reduce recurrence of condition will improve Ability to disclose and discuss suicidal ideas Ability to demonstrate self-control will improve Ability to identify and develop effective coping behaviors will improve Ability to maintain clinical measurements within normal limits will  improve Ability to identify triggers associated with substance abuse/mental health issues will improve  Medication Management: Evaluate patient's response, side effects, and tolerance of medication regimen.  Therapeutic Interventions: 1 to 1 sessions, Unit Group sessions and Medication administration.  Evaluation of Outcomes: Adequate for Discharge  Physician Treatment Plan for Secondary Diagnosis: Principal Problem:   MDD (major depressive disorder), severe (HCC)  Long Term Goal(s): Improvement in symptoms so as ready for discharge Improvement in symptoms so as ready for discharge   Short Term Goals: Ability to identify changes in lifestyle to reduce recurrence of condition will improve Ability to disclose and discuss suicidal ideas Ability to demonstrate self-control will improve Ability to identify and develop effective coping behaviors will improve Ability to identify changes in lifestyle to reduce recurrence of condition will improve Ability to disclose and discuss suicidal ideas Ability to demonstrate self-control will improve Ability to identify and develop effective coping behaviors will improve Ability to maintain clinical measurements within normal limits will improve Ability to identify triggers associated with substance abuse/mental health issues will improve     Medication Management: Evaluate patient's response, side effects, and tolerance of medication regimen.  Therapeutic Interventions: 1 to 1 sessions, Unit Group sessions and Medication administration.  Evaluation of Outcomes: Adequate for Discharge   RN Treatment Plan for Primary Diagnosis: MDD (major depressive disorder), severe (HCC) Long Term Goal(s): Knowledge of disease and therapeutic regimen to maintain health will improve  Short Term Goals: Ability to remain free from injury will improve, Ability to verbalize frustration and anger appropriately will improve, Ability to demonstrate self-control, Ability  to participate in decision making will improve, Ability to verbalize feelings will improve, Ability to disclose and discuss suicidal ideas, Ability to identify and develop effective coping behaviors will improve and Compliance with prescribed medications will improve  Medication Management: RN will administer medications as ordered by provider, will assess and evaluate patient's response and provide education to patient for prescribed medication. RN will report any adverse and/or side effects to prescribing provider.  Therapeutic Interventions: 1 on 1 counseling sessions, Psychoeducation, Medication administration, Evaluate responses to treatment, Monitor vital signs and CBGs as ordered, Perform/monitor CIWA, COWS, AIMS and Fall Risk screenings as ordered, Perform wound care treatments as ordered.  Evaluation of Outcomes: Adequate for Discharge   LCSW Treatment Plan for Primary Diagnosis: MDD (major depressive disorder), severe (HCC) Long Term Goal(s): Safe transition to appropriate next level of care at discharge, Engage patient in therapeutic group addressing interpersonal concerns.  Short Term Goals: Engage patient in aftercare planning with referrals and resources, Increase social support, Increase ability to appropriately verbalize feelings, Increase emotional regulation, Facilitate acceptance of mental health diagnosis and concerns, Facilitate patient progression through stages of change regarding substance use diagnoses and concerns, Identify triggers associated with mental health/substance abuse issues and Increase skills for wellness and recovery  Therapeutic Interventions: Assess for all discharge needs, 1 to 1 time with Social worker, Explore available resources and support systems, Assess for adequacy in community support network, Educate family and significant other(s) on suicide prevention, Complete Psychosocial Assessment, Interpersonal group therapy.  Evaluation of Outcomes: Adequate  for Discharge  Progress in Treatment: Attending groups: Yes Participating in groups: Yes Taking medication as prescribed: Yes. Toleration medication: Yes. Family/Significant other contact made: Yes, individual(s) contacted:  mother Patient understands diagnosis: Yes. Discussing patient identified problems/goals with staff: Yes. Medical problems stabilized or resolved: Yes. Denies suicidal/homicidal ideation: Yes. Issues/concerns per patient self-inventory: No. Other:  New problem(s) identified: None  New Short Term/Long Term Goal(s): medication stabilization, elimination of SI thoughts, development of comprehensive mental wellness plan.   Patient Goals: "I just want to feel better"  Discharge Plan or Barriers: CSW will continue to assess for an appropriate discharge plan.   Reason for Continuation of Hospitalization: None, discharge today.  Estimated Length of Stay: Friday, 03/12/18  Attendees: Patient: 03/12/2018   Physician: Dr Jola Babinski, MD 03/12/2018   Nursing: Lamount Cranker, RN 03/12/2018   RN Care Manager: 03/12/2018   Social Worker: Daleen Squibb, LCSW 03/12/2018   Recreational Therapist:  03/12/2018   Other:  03/12/2018   Other:  03/12/2018        Scribe for Treatment Team: Lorri Frederick, LCSW 03/12/2018 10:07 AM

## 2018-03-12 NOTE — BHH Suicide Risk Assessment (Signed)
[facility]   Principal Problem: MDD (major depressive disorder), severe [facility](HCC) Discharge Diagnoses:  Patient Active Problem List   Diagnosis Date Noted  . MDD (major depressive disorder), severe (HCC) [F32.2] 03/07/2018  . Major depressive disorder, recurrent episode, severe (HCC) [F33.2] 03/06/2018  . Essential hypertension [I10] 04/29/2017  . Hepatitis B immune [Z78.9] 07/28/2016  . Influenza-like illness [R69] 11/09/2013  . HIV disease (HCC) [B20] 10/05/2013  . Dyslipidemia [E78.5] 10/05/2013  . Morbid obesity (HCC) [E66.01] 10/05/2013  . Cigarette smoker [F17.210] 10/05/2013  . Adjustment disorder with mixed anxiety and depressed mood [F43.23] 08/31/2013    Total Time spent with patient: 30 minutes  Musculoskeletal: Strength & Muscle Tone: within normal limits Gait & Station: normal Patient leans: N/A  Psychiatric Specialty Exam: Review of Systems  All other systems reviewed and are negative.   Blood pressure 130/66, pulse 80, temperature 97.8 F (36.6 C), temperature source Oral, resp. rate 12, height 5\' 4"  (1.626 m), weight 100.2 kg (221 lb), last menstrual period 03/05/2018, SpO2 99 %.Body mass index is 37.93 kg/m.  General Appearance: Fairly Groomed  Patent attorneyye Contact::  Good  Speech:  Clear and Coherent409  Volume:  Normal  Mood:  Euthymic  Affect:  Appropriate  Thought Process:  Coherent  Orientation:  Full (Time, Place, and Person)  Thought Content:  Logical  Suicidal Thoughts:  No  Homicidal Thoughts:  No  Memory:  Immediate;   Fair  Judgement:  Good  Insight:  Fair  Psychomotor Activity:  Normal  Concentration:  Good  Recall:  Good  Fund of Knowledge:Good  Language: Good  Akathisia:  No  Handed:  Right  AIMS (if indicated):     Assets:  Communication Skills Desire for Improvement Resilience Social Support  Sleep:  Number of Hours: 6.75  Cognition: WNL  ADL's:  Intact   Mental Status Per Nursing Assessment::   On  Admission:     Demographic Factors:  Access to firearms  Loss Factors: Financial problems/change in socioeconomic status  Historical Factors: Impulsivity  Risk Reduction Factors:   Responsible for children under 39 years of age, Sense of responsibility to family, Positive social support and Positive therapeutic relationship  Continued Clinical Symptoms:  Previous Psychiatric Diagnoses and Treatments  Cognitive Features That Contribute To Risk:  None    Suicide Risk:  Minimal: No identifiable suicidal ideation.  Patients presenting with no risk factors but with morbid ruminations; may be classified as minimal risk based on the severity of the depressive symptoms  Follow-up Information    [facility]. Go on 03/15/2018.   Why:  Please [facility] Monday, [facility]/18, at 8:00am. Contact information: [address] [place] [address] [phone]           Plan Of Care/Follow-up recommendations:  Activity:  ad lib  Antonieta PertGreg Lawson Noelie Renfrow, MD 03/12/2018, 8:06 AM

## 2018-03-12 NOTE — Progress Notes (Signed)
  [facility] Discharge Plan :  Will you be returning to the same living situation after discharge:  Yes,  returning home where patient lives with mother. At discharge, do you have transportation home?: Yes,  mother is picking up Do you have the ability to pay for your medications: Yes,  has [facility] Medicaid  Release of information consent forms completed and in the chart;  Patient's signature needed at discharge.  Patient to Follow up at: Follow-up Information    [facility]. Go on 03/15/2018.   Why:  Please [facility] Monday, 03/[facility]/18, at 8:00am. Contact information: [address] [place] [address] [phone]           Next level of care provider has access to [facility] Link:no  Safety Planning and Suicide Prevention discussed: Yes,  with mother     Has patient been referred to the Quitline?: Patient refused referral  Patient has been referred for addiction treatment: Yes  Darreld Mcleanharlotte C Jhair Witherington, Student-Social Work 03/12/2018, 9:30 AM

## 2018-03-12 NOTE — Progress Notes (Signed)
D) Pt being discharged to home accompanied by her mother. Affect and mood are appropriate. Pt. Denies SI and HI, delusions and hallucinations. A) All belongings returned to Pt. All discharge plans explained to Pt along with her medications.  Pt given praise and reassurance. R) Pt denies SI and HI.

## 2018-03-12 NOTE — Progress Notes (Signed)
Recreation Therapy Notes  Date: 4.12.19 Time: 9:30 a.m. Location: [address] Dayroom   Group Topic: Stress Management   Goal Area(s) Addresses:  Goal 1.1: To reduce stress  -Patient will feel a reduction in stress level  -Patient will understand the importance of stress management  -Patient will participate during stress management group      Intervention: Stress Management  Activity: Meditation- Patients were in a peaceful environment with soft lighting enhancing patients mood. Patients listened to a body scan meditation on the calm app to help decrease tension and stress levels   Education: Stress Management, Discharge Planning.    Education Outcome: Acknowledges edcuation/In group clarification offered/Needs additional education   Clinical Observations/Feedback:: Patient did not attend     Sofi Bryars, Recreation Therapy Intern   Sarah Watkins 03/12/2018 8:48 AM 

## 2018-03-12 NOTE — Discharge Summary (Signed)
Physician Discharge Summary Note  Patient:  Sarah GilbertDemetria S Watkins is an 37 y.o., female MRN:  [id number] DOB:  1981-11-23 Patient phone:  [phone] (home)  Patient address:   [address] [place] [address],  Total Time spent with patient: Greater than 30 minutes  Date of Admission:  03/07/2018 Date of Discharge: 03-12-18  Reason for Admission. Worsening symptoms of depression, suicide attempt by overdose on Prozac.  Principal Problem: MDD (major depressive disorder), severe [facility](HCC) Discharge Diagnoses: Patient Active Problem List   Diagnosis Date Noted  . MDD (major depressive disorder), severe (HCC) [F32.2] 03/07/2018  . Major depressive disorder, recurrent episode, severe (HCC) [F33.2] 03/06/2018  . Essential hypertension [I10] 04/29/2017  . Hepatitis B immune [Z78.9] 07/28/2016  . Influenza-like illness [R69] 11/09/2013  . HIV disease (HCC) [B20] 10/05/2013  . Dyslipidemia [E78.5] 10/05/2013  . Morbid obesity (HCC) [E66.01] 10/05/2013  . Cigarette smoker [F17.210] 10/05/2013  . Adjustment disorder with mixed anxiety and depressed mood [F43.23] 08/31/2013   Past Psychiatric History: Mdd  Past Medical History:  Past Medical History:  Diagnosis Date  . Hepatitis B immune 07/28/2016  . HIV (human immunodeficiency virus infection) (HCC) 01/2013  . HTN (hypertension)   . Obesity (BMI 30.0-34.9)     Past Surgical History:  Procedure Laterality Date  . CESAREAN SECTION     Family History:  Family History  Problem Relation Age of Onset  . Hypertension Mother    Family Psychiatric  History: See H&P  Social History:  Social History   Substance and Sexual Activity  Alcohol Use Yes   Comment: occ, social, less than weekly     Social History   Substance and Sexual Activity  Drug Use Yes  . Frequency: 7.0 times per week  . Types: Marijuana   Comment: marijuana/ rare    Social History   Socioeconomic History  . Marital status: Single    Spouse name: Not on  file  . Number of children: Not on file  . Years of education: Not on file  . Highest education level: Not on file  Occupational History  . Not on file  Social Needs  . Financial resource strain: Not on file  . Food insecurity:    Worry: Not on file    Inability: Not on file  . Transportation needs:    Medical: Not on file    Non-medical: Not on file  Tobacco Use  . Smoking status: Current Every Day Smoker    Packs/day: 0.30    Types: Cigarettes  . Smokeless tobacco: Never Used  Substance and Sexual Activity  . Alcohol use: Yes    Comment: occ, social, less than weekly  . Drug use: Yes    Frequency: 7.0 times per week    Types: Marijuana    Comment: marijuana/ rare  . Sexual activity: Not Currently    Partners: Male    Birth control/protection: IUD    Comment: pt given condoms  Lifestyle  . Physical activity:    Days per week: Not on file    Minutes per session: Not on file  . Stress: Not on file  Relationships  . Social connections:    Talks on phone: Not on file    Gets together: Not on file    Attends religious service: Not on file    Active member of club or organization: Not on file    Attends meetings of clubs or organizations: Not on file    Relationship status: Not on file  Other Topics Concern  . Not on file  Social History Narrative  . Not on file   Hospital Course: (Per Md's admission SRA): Patient presented to the emergency room on April 5.  Reports worsening depression over recent weeks.  On day of admission reports she had overdosed on Prozac.  States she took 4 tablets.  She does endorse suicidal intent and states "I was hoping I would not wake up". (4/5 EKG NSR QTc 452) She cannot identify any specific stressors that may be causing or exacerbating her depression.  She does report recently identified exophthalmos for which she is in the process of being worked up. (February 7 head and orbit CAT scan read as normal, TSH on same date 1.194). She endorses  daily cannabis use and also reports recent drinking and states she has been drinking up to 6 beers per day over the last 3 weeks or so, but she feels that depression preceded heavy drinking. Admission blood alcohol level 195. Endorses neurovegetative symptoms of depression to include poor sleep, poor appetite, low energy level, anhedonia, decreased concentration, she also describes a decreased sense of self-esteem.  Denies psychotic symptoms.  After the above admission evaluation, it was decided based on the seriousness of her symptoms that Sarah Watkins will benefit from mood stabilization treatments. The medication regimen targeting those presenting symptoms were discussed & initiated with her consent. She received & was discharged on; Olanzapine 10 mg for mood control, Sertraline 50 mg for depression & Trazodone 50 mg for insomnia. She was enrolled & participated in the group counseling sessions being offered & held on this unit. She learned coping skills. She was resumed & discharged on all her pertinent home medications for her pre-existing medical conditions presented. She tolerated her treatment regimen without any adverse effects or reactions reported.   Sarah Watkins is seen today for discharge evaluation by the attending psychiatrist. She states she is pleased that she sought help. Says she is tolerating her medications well. No withdrawal symptoms. No craving for alcohol or other drugs. She is no longer feeling depressed. Describes normal energy and ability to think. Able to focus on task. No suicidal thoughts. No homicidal thoughts. No thoughts of violence. Patient reports normal biological functions.    The nursing staff reports that patient has been appropriate on the unit after few days of angry outbursts & agitated behaviors. Patient has been interacting well with peers & staff. No behavioral more issues. Patient has not voiced any suicidal thoughts. Patient has not been observed to be internally  stimulated or pre-occupied. Patient has been adherent with treatment recommendations. Patient has been tolerating her medications well, denies any adverse reactions or side effects.    Patient's case was discussed at the team meeting this morning. The team members feel that patient is back to her baseline level of function. The team agreed with plan to discharge patient today. Upon discharge, Sarah Watkins adamantly denies any SIHI, AVH, delusional thoughts, paranoia or substance withdrawal symptoms. She will continue further psychiatric follow-up care/medication management on an outpatient basis as noted below. She was provided with all the necessary information needed to make this appointment without problems. She received from the [facility] pharmacy, a 7 days worth, supply samples of her [facility] discharge medications. She left [facility] with all personal belongings in no apparent distress. Transportation per mother.  Physical Findings: AIMS: Facial and Oral Movements Muscles of Facial Expression: None, normal Lips and Perioral Area: None, normal Jaw: None, normal Tongue: None, normal,Extremity Movements Upper (arms, wrists,  hands, fingers): None, normal Lower (legs, knees, ankles, toes): None, normal, Trunk Movements Neck, shoulders, hips: None, normal, Overall Severity Severity of abnormal movements (highest score from questions above): None, normal Incapacitation due to abnormal movements: None, normal Patient's awareness of abnormal movements (rate only patient's report): No Awareness, Dental Status Current problems with teeth and/or dentures?: No Does patient usually wear dentures?: No  CIWA:  CIWA-Ar Total: 2 COWS:  COWS Total Score: 2  Musculoskeletal: Strength & Muscle Tone: within normal limits Gait & Station: normal Patient leans: N/A  Psychiatric Specialty Exam: Physical Exam  Constitutional: She appears well-developed.  HENT:  Head: Normocephalic.  Eyes: Pupils are equal, round, and reactive  to light.  Neck: Normal range of motion.  Cardiovascular:  Hx. HTN  Respiratory: Effort normal.  GI: Soft.  Genitourinary:  Genitourinary Comments: Deferred  Musculoskeletal: Normal range of motion.  Neurological: She is alert.  Skin: Skin is warm.    Review of Systems  Constitutional: Negative.   HENT: Negative.   Eyes: Negative.   Respiratory: Negative.   Cardiovascular: Negative.   Gastrointestinal: Negative.   Genitourinary: Negative.   Musculoskeletal: Negative.   Skin: Negative.   Neurological: Negative.   Endo/Heme/Allergies: Negative.   Psychiatric/Behavioral: Positive for depression (Stable) and substance abuse (Hx. Alcohol, Cocaine & THC use disorders). Negative for hallucinations, memory loss and suicidal ideas. The patient has insomnia (Stable). The patient is not nervous/anxious.     Blood pressure 130/66, pulse 80, temperature 97.8 F (36.6 C), temperature source Oral, resp. rate 12, height 5\' 4"  (1.626 m), weight 100.2 kg (221 lb), last menstrual period 03/05/2018, SpO2 99 %.Body mass index is 37.93 kg/m.  See H&P   Has this patient used any form of tobacco in the last 30 days? (Cigarettes, Smokeless Tobacco, Cigars, and/or Pipes): N/A  Blood Alcohol level:  Lab Results  Component Value Date   ETH 192 (H) 03/05/2018   ETH <11 08/30/2013   Metabolic Disorder Labs:  Lab Results  Component Value Date   HGBA1C 5.3 04/29/2017   MPG 105 04/29/2017   No results found for: PROLACTIN Lab Results  Component Value Date   CHOL 243 (H) 12/07/2017   TRIG 112 12/07/2017   HDL 36 (L) 12/07/2017   CHOLHDL 6.8 (H) 12/07/2017   VLDL 26 04/29/2017   LDLCALC 183 (H) 12/07/2017   LDLCALC 168 (H) 04/29/2017    See Psychiatric Specialty Exam and Suicide Risk Assessment completed by Attending Physician prior to discharge.  Discharge destination:  Home  Is patient on multiple antipsychotic therapies at discharge:  No   Has Patient had three or more failed trials of  antipsychotic monotherapy by history:  No  Recommended Plan for Multiple Antipsychotic Therapies: NA  Allergies as of 03/12/2018      Reactions   Sulfa Antibiotics Hives, Itching, Swelling, Rash, Other (See Comments)   Various gland swelling.      Medication List    STOP taking these medications   FLUoxetine 10 MG capsule Commonly known as:  PROZAC   levonorgestrel 20 MCG/24HR IUD Commonly known as:  MIRENA   ondansetron 4 MG tablet Commonly known as:  ZOFRAN   Potassium Chloride ER 20 MEQ Tbcr     TAKE these medications     Indication  elvitegravir-cobicistat-emtricitabine-tenofovir 150-150-200-10 MG Tabs tablet Commonly known as:  GENVOYA Take 1 tablet by mouth daily with breakfast. For HIV Start taking on:  03/13/2018 What changed:  additional instructions  Indication:  HIV Disease   hydrochlorothiazide  25 MG tablet Commonly known as:  HYDRODIURIL Take 1 tablet (25 mg total) by mouth daily. For high blood pressure What changed:  additional instructions  Indication:  High Blood Pressure Disorder   hydrOXYzine 25 MG tablet Commonly known as:  ATARAX/VISTARIL Take 1 tablet (25 mg total) by mouth 3 (three) times daily as needed (mild/moderate anxiety).  Indication:  Feeling Anxious   OLANZapine 10 MG tablet Commonly known as:  ZYPREXA Take 1 tablet (10 mg total) by mouth at bedtime. For mood control  Indication:  Mood control   sertraline 50 MG tablet Commonly known as:  ZOLOFT Take 1 tablet (50 mg total) by mouth daily. For depression Start taking on:  03/13/2018  Indication:  Major Depressive Disorder   traZODone 50 MG tablet Commonly known as:  DESYREL Take 1 tablet (50 mg total) by mouth at bedtime as needed for sleep.  Indication:  Trouble Sleeping      Follow-up [facility]. Go on 03/15/2018.   Why:  Please attend your follow up appt at [facility] on Monday, 03/15/18, at 8:00am. Contact information: [address] [place]  [address] [phone]          Follow-up recommendations: Activity:  As tolerated Diet: As recommended by your primary care doctor. Keep all scheduled follow-up appointments as recommended.   Comments: Patient is instructed prior to discharge to: Take all medications as prescribed by his/her mental healthcare provider. Report any adverse effects and or reactions from the medicines to his/her outpatient provider promptly. Patient has been instructed & cautioned: To not engage in alcohol and or illegal drug use while on prescription medicines. In the event of worsening symptoms, patient is instructed to call the crisis hotline, 911 and or go to the nearest ED for appropriate evaluation and treatment of symptoms. To follow-up with his/her primary care provider for your other medical issues, concerns and or health care needs.  Signed: Armandina Stammer, NP, PMHNP, FNP-BC 03/12/2018, 12:25 PM

## 2018-04-12 ENCOUNTER — Other Ambulatory Visit: Payer: Self-pay | Admitting: *Deleted

## 2018-04-12 DIAGNOSIS — B20 Human immunodeficiency virus [HIV] disease: Secondary | ICD-10-CM

## 2018-04-12 MED ORDER — ELVITEG-COBIC-EMTRICIT-TENOFAF 150-150-200-10 MG PO TABS: 1.0000 | ORAL_TABLET | Freq: Every day | ORAL | 1 refills | Status: DC

## 2018-04-14 ENCOUNTER — Telehealth: Payer: Self-pay | Admitting: *Deleted

## 2018-04-14 MED ORDER — SERTRALINE HCL 50 MG PO TABS: 50.0000 mg | ORAL_TABLET | Freq: Every day | ORAL | 5 refills | Status: DC

## 2018-04-14 NOTE — Telephone Encounter (Signed)
Patient called and advised she was recently in Behavioral health and the fluoxitine Dr Ninetta Lights had been giving her was stopped and changed to Zoloft. She was given 30 days when she was released and was unaware she did not have refills. She is trying to find out from them how and where she needs to follow up and would like for Dr Ninetta Lights to refill he zoloft until she can get it straightened out. Advised her will ask and give her a call back once he responds.

## 2018-04-14 NOTE — Telephone Encounter (Signed)
Glad to refill

## 2018-04-14 NOTE — Addendum Note (Signed)
Addended by: Lurlean Leyden on: 04/14/2018 04:55 PM   Modules accepted: Orders

## 2018-04-15 ENCOUNTER — Telehealth: Payer: Self-pay | Admitting: *Deleted

## 2018-04-15 NOTE — Telephone Encounter (Signed)
Patient called, left message in triage asking about refills/authorization. RN attempted to return the call twice, but the call would not go through - "Unable to complete your call at this time, please try again later" Andree Coss, RN

## 2018-05-26 ENCOUNTER — Other Ambulatory Visit: Payer: Self-pay

## 2018-05-26 DIAGNOSIS — B20 Human immunodeficiency virus [HIV] disease: Secondary | ICD-10-CM

## 2018-05-27 LAB — T-HELPER CELL (CD4) - (RCID CLINIC ONLY)
CD4 T CELL HELPER: 33 % (ref 33–55)
CD4 T Cell Abs: 1140 /uL (ref 400–2700)

## 2018-05-28 LAB — HIV-1 RNA QUANT-NO REFLEX-BLD
HIV 1 RNA QUANT: NOT DETECTED {copies}/mL
HIV-1 RNA Quant, Log: <1.3 Log copies/mL

## 2018-06-09 ENCOUNTER — Ambulatory Visit: Payer: Self-pay | Admitting: Infectious Diseases

## 2018-06-14 ENCOUNTER — Ambulatory Visit (INDEPENDENT_AMBULATORY_CARE_PROVIDER_SITE_OTHER): Payer: Self-pay | Admitting: Infectious Diseases

## 2018-06-14 ENCOUNTER — Encounter: Payer: Self-pay | Admitting: Infectious Diseases

## 2018-06-14 VITALS — BP 125/86 | HR 76 | Temp 97.5°F | Ht 64.0 in | Wt 238.0 lb

## 2018-06-14 DIAGNOSIS — Z113 Encounter for screening for infections with a predominantly sexual mode of transmission: Secondary | ICD-10-CM

## 2018-06-14 DIAGNOSIS — F4323 Adjustment disorder with mixed anxiety and depressed mood: Secondary | ICD-10-CM

## 2018-06-14 DIAGNOSIS — Z23 Encounter for immunization: Secondary | ICD-10-CM

## 2018-06-14 DIAGNOSIS — Z79899 Other long term (current) drug therapy: Secondary | ICD-10-CM

## 2018-06-14 DIAGNOSIS — B20 Human immunodeficiency virus [HIV] disease: Secondary | ICD-10-CM

## 2018-06-14 NOTE — Assessment & Plan Note (Signed)
Doing well Will check her TSH as per ophtho Get her on dental list.  Get her on PAP list Give condoms Give pneumovax (23) rtc in 6 months

## 2018-06-14 NOTE — Addendum Note (Signed)
Addended by: Gildardo GriffesHILL, ASHLEY M on: 06/14/2018 12:21 PM   Modules accepted: Orders

## 2018-06-14 NOTE — Assessment & Plan Note (Signed)
[facility]  continue her zoloft.

## 2018-06-14 NOTE — Assessment & Plan Note (Signed)
Encouraged to exercise, she is aware of need to watch diet.  Suspect related to mood d/o.  Will check TSH

## 2018-06-14 NOTE — Progress Notes (Signed)
   Subjective:    Patient ID: Sarah Watkins, female    DOB: [birth year], 37 y.o.   MRN: [id number]  HPI 37 yo F with HIV+ since 2014. Has been on genvoya.  Prev had IUD.  Has been active with ex-husband, also positive.Has 39 yo son.  [facility]. Still working on depression. Gets "aggitated really quick".   Then misses work. Works as a LawyerCNA.  Has been taking zoloft daily. Feels like it helps, has been less socially isolated. Not having SI.   Has been out of counseling since Sherrie left. Has missed appt with [facility].   Missed 1 dose of ART since last visit.   HIV 1 RNA Quant (copies/mL)  Date Value  05/26/2018 <20 NOT DETECTED  12/07/2017 <20 NOT DETECTED  04/29/2017 <20 DETECTED (A)   CD4 T Cell Abs (/uL)  Date Value  05/26/2018 1,140  12/07/2017 770  04/29/2017 1,010     Review of Systems  Constitutional: Negative for appetite change and unexpected weight change.  Respiratory: Negative for cough and shortness of breath.   Gastrointestinal: Negative for constipation and diarrhea.  Genitourinary: Negative for difficulty urinating, genital sores and menstrual problem.  Psychiatric/Behavioral: Negative for sleep disturbance and suicidal ideas.  takes prn trazadone- has not taken for "awhile".  Has seen optho. L eye swelling. Wants TSH checked. No vision change.  Overeating. Not exercising.  Please see HPI. All other systems reviewed and negative.     Objective:   Physical Exam  Constitutional: She appears well-developed and well-nourished.  HENT:  Mouth/Throat: No oropharyngeal exudate.  Eyes: Pupils are equal, round, and reactive to light. EOM are normal.  Neck: Normal range of motion. Neck supple.  Cardiovascular: Normal rate, regular rhythm and normal heart sounds.  Pulmonary/Chest: Effort normal and breath sounds normal.  Abdominal: Soft. Bowel sounds are normal. She exhibits no distension. There is no guarding.  Musculoskeletal: She  exhibits no edema.  Lymphadenopathy:    She has no cervical adenopathy.  Psychiatric: She has a normal mood and affect.      Assessment & Plan:

## 2018-06-15 ENCOUNTER — Ambulatory Visit: Payer: Self-pay | Admitting: Licensed Clinical Social Worker

## 2018-06-15 ENCOUNTER — Telehealth: Payer: Self-pay

## 2018-06-15 LAB — TSH: TSH: 0.58 mIU/L

## 2018-06-15 LAB — BASIC METABOLIC PANEL
BUN: 11 mg/dL (ref 7–25)
CALCIUM: 9.6 mg/dL (ref 8.6–10.2)
CO2: 27 mmol/L (ref 20–32)
Chloride: 102 mmol/L (ref 98–110)
Creat: 0.77 mg/dL (ref 0.50–1.10)
GLUCOSE: 100 mg/dL — AB (ref 65–99)
POTASSIUM: 3.6 mmol/L (ref 3.5–5.3)
SODIUM: 137 mmol/L (ref 135–146)

## 2018-06-15 NOTE — Telephone Encounter (Signed)
PT called today to reschedule an appt for counseling she scheduled to come into clinic 7/22 at 3 pm. Pt is aware of this appt and will try to come next Monday.  Lorenso CourierJose L Maldonado, [place]

## 2018-06-21 ENCOUNTER — Other Ambulatory Visit: Payer: Self-pay | Admitting: Infectious Diseases

## 2018-06-21 ENCOUNTER — Ambulatory Visit (INDEPENDENT_AMBULATORY_CARE_PROVIDER_SITE_OTHER): Payer: Self-pay | Admitting: Licensed Clinical Social Worker

## 2018-06-21 DIAGNOSIS — I10 Essential (primary) hypertension: Secondary | ICD-10-CM

## 2018-06-21 DIAGNOSIS — F331 Major depressive disorder, recurrent, moderate: Secondary | ICD-10-CM

## 2018-06-21 NOTE — BH Specialist Note (Signed)
Integrated Behavioral Health Initial Visit  MRN: [id number] Name: Sarah Watkins  Number of Integrated Behavioral Health Clinician visits:: 1/6 Session Start time: 3:20pm  Session End time: 4:13pm Total time: 50 minutes  Type of Service: Integrated Behavioral Health- Individual/Family Interpretor:No. Interpretor Name and Language: n/a   SUBJECTIVE: Sarah Watkins is a 37 y.o. female accompanied by self Patient self-referred for depressive symptoms.  Patient reports the following symptoms/concerns: wanting to be alone, not enjoying things, feeling worthless,   getting angry easily at small things, history of suicidal thoughts (none presently) Duration of problem: 1 year; Severity of problem: moderate  OBJECTIVE: Mood: Depressed and Affect: Blunt Risk of harm to self or others: No plan to harm self or others  LIFE CONTEXT: Patient reports that she and her son are living with her mother, and mother is a big help in taking care of son since she is experiencing depression and doesn't feel she is able to do so as well as she should/usually could. She states that she and her mother are very close, and that mother is very close with son as well. 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 She has begun walking away from situations when she gets irritated, and two weeks ago walked off her job. This is very unlike patient. Currently she is not seeing anyone romantically, but is still somewhat involved with son's father. Patient reports she feels bad about this, because she knows that he is the one she contracted HIV from and that he was and still is having sex with men, but that she feels since they both have HIV there is less risk and this way she is not looking for another relationship with someone who may ultimately reject her when she discloses her status to him.    GOALS ADDRESSED: Patient will: 1. Reduce symptoms of: depression 2. Demonstrate ability to: Increase healthy adjustment to current life circumstances  INTERVENTIONS: Interventions utilized: Motivational Interviewing, Brief CBT and Supportive Counseling    ASSESSMENT: Patient currently experiencing anhedonia, feelings of guilt and worthlessness, desire to isolate, depressed mood, crying spells, and irritability. She reports that she was admitted on IVC to [facility] in April after overdosing on Fluoxetine and Vodka. Patient denies any thoughts of self-harm at this time, and names her mother and son as reasons that she would not harm herself. Existing diagnosis of Major Depressive Disorder, Recurrent, Moderate, is most consistent with current symptoms. Counselor discussed with patient her thoughts and feelings regarding guilt and worthlessness. Patient used words such as "nasty" and "disgusting" to describe herself, as a result of her HIV diagnosis. Counselor processed with patient what these words mean to her. Patient was able to identify that not much has changed about who she is as a person due to diagnosis, but it is how she sees herself that has changed. Counselor educated patient on [employer]. Counselor and patient explored how this relates to her thought processes, self-sabotaging behaviors, and depressed moods. Counselor recommended that patient recognize and jot down her self-defeating thoughts as they come up this week, and patient agreed to do so.    Patient may benefit from ongoing CBT to address self-concept and self-sabotaging behaviors that reinforce depressive episode.   PLAN: 1. Follow up with behavioral health clinician on : 06/30/18 at 3pm 2. Behavioral recommendations: notice and log self-defeating thoughts for next session;  example for functional analysis: cousin called about having lunch, patient felt as though she didn't deserve to be around cousin and  as though cousin will eventually reject her if she finds out about HIV status, so why nurture the relationship.   Angus Palmsegina Shamar Kracke, LCSW

## 2018-06-24 ENCOUNTER — Ambulatory Visit (INDEPENDENT_AMBULATORY_CARE_PROVIDER_SITE_OTHER): Payer: Self-pay | Admitting: Infectious Diseases

## 2018-06-24 ENCOUNTER — Encounter: Payer: Self-pay | Admitting: Infectious Diseases

## 2018-06-24 DIAGNOSIS — Z124 Encounter for screening for malignant neoplasm of cervix: Secondary | ICD-10-CM

## 2018-06-24 DIAGNOSIS — Z30431 Encounter for routine checking of intrauterine contraceptive device: Secondary | ICD-10-CM

## 2018-06-24 NOTE — Progress Notes (Signed)
      Subjective:    Sarah Watkins is a 37 y.o. female here for an annual pelvic exam and pap smear.   Review of System:  Current GYN complaints or concerns: some white/yellow mucus that is more than she is used to. She has a Mirena IUD in place that needed to be removed in February of this year-requested if I can pull it out today. No plans for further children. Son that is 39 yo via c/section.    Patient denies any abdominal/pelvic pain, problems with bowel movements, urination or intercourse.   Past Medical History:  Diagnosis Date  . Hepatitis B immune 07/28/2016  . HIV (human immunodeficiency virus infection) (HCC) 01/2013  . HTN (hypertension)   . Obesity (BMI 30.0-34.9)     Gynecologic History: No obstetric history on file.  No LMP recorded. Contraception: IUD Last Pap: not on file. Results were: normal per patient report  Anal Intercourse: no Last Mammogram: never no family h/o breast cancer.   Objective:  Physical Exam  Constitutional: Well developed, well nourished, no acute distress. She is alert and oriented x3.  Pelvic: External genitalia is normal in appearance. The vagina is normal in appearance. The cervix is bulbous and easily visualized. Black IUD string visualized through Os. No CMT, increased white cervical mucus present without odor.  Breasts: symmetrical in contour, shape and texture. No palpable masses/nodules. No nipple discharge.  Psych: She has a normal mood and affect.    Assessment:  Normal pelvic and bimanual exam. Attempted to remove IUD however we did not have instrumentation that was long enough available. Thin prep pap was obtained and sent for cytology with HPV and GC/C today. Suspicious for BV, however may be normal cervical mucus considering her hormonal contraceptive is expired within the last 5 months. Normal clinical breast exam.   Plan:  Health Maintenance =   Return in 1 year for annual pap screening unless indicated sooner.    She has been counseled and instructed how to perform monthly self breast exams.  Screening mammogram to start at 39 yo as recommended per ACS.   Contraception / Family Planning =   Thinking about options now that her IUD is expired   [facility] and consider alternative.   HIV =   She will continue her Genvoya and F/U as scheduled with Dr. Ninetta LightsHatcher for ongoing HIV care.   Rexene AlbertsStephanie Elbert Polyakov, MSN, NP-C [facility] Office: [phone] Pager: [phone]  06/24/18 10:04 AM

## 2018-06-29 LAB — CYTOLOGY - PAP
Bacterial vaginitis: POSITIVE — AB
CHLAMYDIA, DNA PROBE: NEGATIVE
Diagnosis: UNDETERMINED — AB
HPV: NOT DETECTED
Neisseria Gonorrhea: NEGATIVE

## 2018-06-29 MED ORDER — METRONIDAZOLE 500 MG PO TABS: 500.0000 mg | ORAL_TABLET | Freq: Two times a day (BID) | ORAL | 0 refills | Status: AC

## 2018-06-29 NOTE — Addendum Note (Signed)
Addended by: Blanchard KelchIXON, STEPHANIE N on: 06/29/2018 03:01 PM   Modules accepted: Orders

## 2018-06-29 NOTE — Progress Notes (Signed)
Discussed pap results with Denean and referral for colposcopy. Rx for metronidazole sent in and discussed.

## 2018-06-30 ENCOUNTER — Ambulatory Visit: Payer: Self-pay | Admitting: Licensed Clinical Social Worker

## 2018-07-07 ENCOUNTER — Telehealth: Payer: Self-pay | Admitting: *Deleted

## 2018-07-07 DIAGNOSIS — R87610 Atypical squamous cells of undetermined significance on cytologic smear of cervix (ASC-US): Secondary | ICD-10-CM

## 2018-07-07 NOTE — Telephone Encounter (Signed)
Patient called to check on her referral to womens. Advised of the mix up and will have Sarah Watkins to give her a call once it is done.

## 2018-07-07 NOTE — Telephone Encounter (Signed)
Thank you I appreciate that. I wondered if that would have delayed things a bit.

## 2018-07-08 NOTE — Addendum Note (Signed)
Addended by: Blanchard KelchIXON, STEPHANIE N on: 07/08/2018 09:54 AM   Modules accepted: Orders

## 2018-07-15 ENCOUNTER — Ambulatory Visit (INDEPENDENT_AMBULATORY_CARE_PROVIDER_SITE_OTHER): Payer: Self-pay | Admitting: Licensed Clinical Social Worker

## 2018-07-15 DIAGNOSIS — F331 Major depressive disorder, recurrent, moderate: Secondary | ICD-10-CM

## 2018-07-15 NOTE — BH Specialist Note (Signed)
Integrated Behavioral Health Follow Up Visit  MRN: [id number] Name: Sarah GilbertDemetria S Mcewen  Number of Integrated Behavioral Health Clinician visits: 2/6 Session Start time: 9:06am  Session End time: 9:47am Total time: 40 minutes  Type of Service: Integrated Behavioral Health- Individual/Family Interpretor:No. Interpretor Name and Language: n/a  SUBJECTIVE: Sarah Watkins is a 37 y.o. female accompanied by self Patient reports the following symptoms/concerns: desire to isolate, anxiety around people, feeling pressured, no enjoyment, feelings of guilt and worthlessness, easily angered  OBJECTIVE: Mood: Depressed and Affect: Constricted Risk of harm to self or others: No plan to harm self or others  LIFE CONTEXT: Patient reports that she has been trying to spend more time with her son, by taking him along when she does things and going on walks together. She reports that this seems to be helping the both of them. Patient is no longer seeing her son's father. She indicates that since last session she moved in with him briefly, but they saw that the relationship was not going to work and she moved back with her mother. Patient states she got a new job and attended orientation, but on the day she was supposed to report for floor training/shadowing (last Friday) she arrived but could not get out of her car in the parking lot, and went home instead. She did not call the employer to explain and has had no further contact with them. After this session, patient plans to go to the place of employment and talk to the administration.   GOALS ADDRESSED: Patient will: 1.  Reduce symptoms of: depression   INTERVENTIONS: Interventions utilized:  Solution-Focused Strategies, Brief CBT and Supportive Counseling   ASSESSMENT: Patient currently experiencing anhedonia, isolation, social anxiety symptoms, stress, feelings of worthlessness, irritability, self-sabotaging thoughts and behaviors. Counselor and  patient processed recent experience when patient arrived at new job but did not exit her car. Patient struggled to identify thoughts she had at that time, saying only that she knew she couldn't go in and had to leave instead. Counselor guided her to reflect on a situation at a previous job (where she went to her training, but felt overwhelmed and left without telling anyone) and to explore similarities between the two so that she can gain understanding of these situations. Counselor pointed out that these are both self-sabotaging behaviors, and processed with patient the need she is meeting with them. 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 Counselor and patient explored a functional analysis of patient's behaviors and how this is or is not meeting the needs patient has. Patient guided counselor through description of how her body was feeling in the moment, and counselor taught patient relaxation/stress inocculation skills to address the somatic symptoms and therefore impact the emotional symptoms. Patient acknowledged that she does not know what to expect or what she will do when she goes to the administrative office today. Counselor encouraged patient to think through and prepare for the situation. Counselor and patient explored possible actions and outcomes, so that patient can prepare herself to address the administration. Counselor pointed out patient's need for control in situations around others, and patient agreed that this is a driving factor for her. Counselor reframed self-sabotaging behaviors and pointed out that choosing to do the opposite of these is a way to take back some control over the situation. Patient and counselor practiced some self-talk to help  her feel more in control.  Patient may benefit from ongoing CBT for depression and self-sabotaging  behaviors.   PLAN: Follow up with behavioral health clinician on : 07/21/18@9am   Angus Palmsegina [place] Schnapp, LCSW

## 2018-07-21 ENCOUNTER — Ambulatory Visit: Payer: Self-pay | Admitting: Licensed Clinical Social Worker

## 2018-07-26 ENCOUNTER — Ambulatory Visit: Payer: Self-pay | Admitting: Licensed Clinical Social Worker

## 2018-08-05 ENCOUNTER — Other Ambulatory Visit: Payer: Self-pay | Admitting: *Deleted

## 2018-08-05 ENCOUNTER — Other Ambulatory Visit: Payer: Self-pay

## 2018-08-05 DIAGNOSIS — Z113 Encounter for screening for infections with a predominantly sexual mode of transmission: Secondary | ICD-10-CM

## 2018-08-06 LAB — URINE CYTOLOGY ANCILLARY ONLY
Chlamydia: NEGATIVE
Neisseria Gonorrhea: NEGATIVE

## 2018-08-06 LAB — RPR: RPR Ser Ql: NONREACTIVE

## 2018-08-09 ENCOUNTER — Ambulatory Visit (INDEPENDENT_AMBULATORY_CARE_PROVIDER_SITE_OTHER): Payer: Self-pay | Admitting: Licensed Clinical Social Worker

## 2018-08-09 DIAGNOSIS — F331 Major depressive disorder, recurrent, moderate: Secondary | ICD-10-CM

## 2018-08-09 NOTE — BH Specialist Note (Signed)
Integrated Behavioral Health Follow Up Visit  MRN: [id number] Name: OMIA BERTZ  Number of Integrated Behavioral Health Clinician visits: 3/6 Session Start time: 10:02am  Session End time: 10:35am Total time: 30 minutes  Type of Service: Integrated Behavioral Health- Individual/Family Interpretor:No. Interpretor Name and Language: n/a  SUBJECTIVE: PEONY LAVORGNA is a 37 y.o. female accompanied by self Patient reports the following symptoms/concerns: lack of motivation, no energy, depressed mood, feeling worthless, guilty feelings, trouble concentrating, angers easily  OBJECTIVE: Mood: Depressed and Affect: Depressed Risk of harm to self or others: No plan to harm self or others  LIFE CONTEXT: Patient reports that she never contacted her job that she failed to show up for the first day of, and has been staying at home with her mother now that son is back in school. She indicates that son has been having trouble at school and she feels like it is her fault for withdrawing from him. Patient also states that she has continued to have intimate contact with son's father, who recently learned that he has syphillis. Patient has been tested a few days ago and has not yet learned of her results. She reports anger at herself for allowing this to happen.    GOALS ADDRESSED: Patient will: 1.  Reduce symptoms of: depression   INTERVENTIONS: Interventions utilized:  Motivational Interviewing, Brief CBT and Supportive Counseling   ASSESSMENT: Patient currently experiencing irritability, crying spells, lack of motivation and energy, anhedonia, depressed mood, feelings of worthlessness and excessive guilt, difficulty concentrating. Counselor and patient processed patient's experiences of feeling worthless and guilty, regarding her relationships with son and his father. Patient indicates that her irritability and inability to help son with his homework have probably had an affect on him, and  that is why he is being angry and mean to classmates. Counselor guided patient to discuss what she sess as important in her interactions with son and coached patient in developing strategies to manage her frustration, which she can then model for son. Patient stated that her worthless feelings also come from her relationship with her son's father. She reports she hates that she still loves him, and that she continues to be involved with him. Counselor challenged patient to identify what it is about the relationship that makes her feel worthless. Patient stated that son's father did not tell her that he was sleeping with men, and that he put her at risk and infected her with HIV by doing so. Counselor validated patient's feelings of anger, and challenged her about why she aims them at herself rather than son's father. Patient struggled to put responsibility on anyone but herself. Counselor guided patient in functional analysis of this situation. Patient was able to recognize that son's father was dishonest and put her at risk, but states that it is worse that she knows this and still puts herself in the situation of being with him, so she is angrier at herself.  Patient may benefit from CBT and Reality Therapy on an ongoing basis.  PLAN: 1. Follow up with behavioral health clinician on : 9/19   Angus Palms, LCSW

## 2018-08-16 ENCOUNTER — Ambulatory Visit (INDEPENDENT_AMBULATORY_CARE_PROVIDER_SITE_OTHER): Payer: Self-pay | Admitting: Licensed Clinical Social Worker

## 2018-08-16 DIAGNOSIS — F331 Major depressive disorder, recurrent, moderate: Secondary | ICD-10-CM

## 2018-08-16 NOTE — BH Specialist Note (Signed)
Integrated Behavioral Health Follow Up Visit  MRN: [id number] Name: Kenna GilbertDemetria S Lona  Number of Integrated Behavioral Health Clinician visits: 4/6 Session Start time: 10:30am  Session End time:11:00am Total time: 30 minutes  Type of Service: Integrated Behavioral Health- Individual/Family Interpretor:No. Interpretor Name and Language:n/a  SUBJECTIVE: Kenna GilbertDemetria S Tawney is a 37 y.o. female accompanied by self Patient reports the following symptoms/concerns: nervousness/anxiety, sleep disturbance, self-doubt  OBJECTIVE: Mood: Euthymic and Affect: Appropriate Risk of harm to self or others: No plan to harm self or others  LIFE CONTEXT: Patient presents as much happier and more engaged than in last session. She reports that things have been going well - she got back her labwork and did not contract syphilis from son's father. In addition, she has had positive interactions with family members and has been getting out of the house more. 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 At this point, patient has requested and received information about doing so, but has not yet filled out the application form. She reports that her mother and step-father have been very supportive lately, encouraging her and paying both her car payment and to get her a new tire.  GOALS ADDRESSED: Patient will: 1.  Reduce symptoms of: depression   INTERVENTIONS: Interventions utilized:  Brief CBT and Supportive Counseling   ASSESSMENT: Patient currently experiencing nervousness, difficulty falling/staying asleep, and doubts about her worth. She presents in a much improved mood than at last session. Counselor explored with patient ways that she improved her mood; patient identified that she struggled to get motivated, but that her mother was a great encouragement to her. She was further able to identify some ways that she was able to  keep her mood elevated - self-care items such as having her hair done and focusing more on how she felt about her appearance as well as social connection by spending time with family and accepting invitation from her cousin. Counselor commended patient on taking these steps. Counselor processed with patient other steps she has taken that have made her feel good about herself or more positive about life in general. Patient set a boundary with son's father to protect herself physically/medically, she thought through and made an insightful choice about beginning a new romantic relationship, and she took the lead on her mother's surprise birthday party which makes her feel good because she likes giving back to the person who has supported her most. Counselor emphasized the importance of recognizing these efforts, and explored with patient how small positive acts and choices can create a positive reinforcement cycle much in the same that she has experienced negative ones creating a negative cycle. Patient and counselor reviewed cognitive triangle and counselor facilitated a functional analysis of these cyclical behaviors. Counselor encouraged patient to keep a log of the activities she engages in and how it impacts her mood, so that when she is feeling down she can look back in the past and see what she did that had a positive impact. Counselor emphasized intentionality in this, as it is easier to remember what impacts us negatively and therefore feed into a cycle of negativity.   Patient may benefit from weekly ongoing CBT.  PLAN: 1. Follow up with behavioral health clinician on : 08/18/18   Angus Palmsegina [place], LCSW

## 2018-08-17 ENCOUNTER — Ambulatory Visit (HOSPITAL_COMMUNITY)
Admission: RE | Admit: 2018-08-17 | Discharge: 2018-08-17 | Disposition: A | Discharge status: Home / Self Care | Payer: Self-pay | Source: Ambulatory Visit | Attending: Obstetrics and Gynecology | Admitting: Obstetrics and Gynecology

## 2018-08-17 ENCOUNTER — Encounter (HOSPITAL_COMMUNITY): Payer: Self-pay

## 2018-08-17 VITALS — BP 136/86 | Ht 64.0 in | Wt 244.0 lb

## 2018-08-17 DIAGNOSIS — Z1239 Encounter for other screening for malignant neoplasm of breast: Secondary | ICD-10-CM

## 2018-08-17 DIAGNOSIS — R87610 Atypical squamous cells of undetermined significance on cytologic smear of cervix (ASC-US): Secondary | ICD-10-CM

## 2018-08-17 NOTE — Addendum Note (Signed)
Encounter addended by: Priscille HeidelbergBrannock, Zakhi Dupre P, RN on: 08/17/2018 3:55 PM  Actions taken: Sign clinical note

## 2018-08-17 NOTE — Progress Notes (Addendum)
[facility] due to having an abnormal Pap smear on 06/24/2018 that a colposcopy is recommended for follow-up.  Pap Smear: Pap smear not completed today. [facility] with negative HPV. Patient has a history of HIV positive. [facility] to follow-up for abnormal Pap smear. Appointment scheduled for Wednesday, August 25, 2018 at 0915. Per patient has a history of an abnormal Pap smear over 10 years ago that a colposcopy was completed for follow-up. Last Pap smear result is in Epic.  Physical exam: Breasts Breasts symmetrical. No skin abnormalities bilateral breasts. No nipple retraction bilateral breasts. No nipple discharge bilateral breasts. No lymphadenopathy. No lumps palpated bilateral breasts. No complaints of pain or tenderness on exam. Screening mammogram recommended at age 37 unless clinically indicated prior.     Pelvic/Bimanual No Pap smear completed today since last Pap smear was 06/24/2018. Pap smear not indicated per BCCCP guidelines.   Smoking History: Patient is a current smoker. Discussed smoking cessation with patient. Referred [facility] the [facility] and gave resources to free [facility] classes [facility].  Patient Navigation: Patient education provided. Access to services provided for patient through [facility]CP program.   Breast and Cervical Cancer Risk Assessment: Patient has a family history of her maternal grandmother and paternal grandmother having breast cancer. Patient has no known genetic mutations or history of radiation treatment to the chest before age 37. Patient has no history of cervical dysplasia. Patient has a history of HIV. Patient has no history of DES exposure in-utero.  Risk Assessment    Risk Scores      08/17/2018   Last edited by: Lynnell DikeHolland, Sabrina H, LPN   5-year risk: 0.5 %   Lifetime risk:  9.7 %

## 2018-08-17 NOTE — Patient Instructions (Signed)
Explained breast self awareness with Kenna Gilbertemetria S Gosling. Patient did not need a Pap smear today due to last Pap smear was 06/24/2018. Explained the colposcopy the recommended follow-up for patients abnormal Pap smear. [facility] for a colposcopy to follow-up for abnormal Pap smear. Appointment scheduled for Wednesday, August 25, 2018 at 0915. Patient aware of appointment and will be there. Let patient know a screening mammogram is recommended at age 37 unless clinically indicated prior. Discussed smoking cessation with patient. Referred to the [facility] and gave resources to free smoking cessation classes [facility]. Jaquelyn S Bingman verbalized understanding.  Brannock, Kathaleen Maserhristine Poll, RN 2:05 PM

## 2018-08-18 ENCOUNTER — Encounter (HOSPITAL_COMMUNITY): Payer: Self-pay | Admitting: *Deleted

## 2018-08-24 ENCOUNTER — Ambulatory Visit: Payer: Self-pay | Admitting: Licensed Clinical Social Worker

## 2018-08-25 ENCOUNTER — Ambulatory Visit: Payer: Self-pay | Admitting: Obstetrics & Gynecology

## 2018-08-26 ENCOUNTER — Ambulatory Visit: Payer: Self-pay | Admitting: Licensed Clinical Social Worker

## 2018-09-10 ENCOUNTER — Other Ambulatory Visit: Payer: Self-pay | Admitting: Infectious Diseases

## 2018-09-10 DIAGNOSIS — B20 Human immunodeficiency virus [HIV] disease: Secondary | ICD-10-CM

## 2018-09-27 ENCOUNTER — Telehealth: Payer: Self-pay | Admitting: *Deleted

## 2018-09-27 NOTE — Telephone Encounter (Signed)
Received a call from Sarah Watkins stating some addititional work needs to be done on the [facility] application and the patient needs her medications now. She wanted to know if I could help Sarah Watkins.   Contacted Timmothy Sours and we can do emergency assistance. Patient has been approved and Sarah Watkins states she can pick the medications up. She confirms that she uses [facility] on [address].  Contacted [facility] and gave them the below information Immediate Access for Sarah Watkins [id number] PCN [id number] [facility] [id number] [place] [id number]  [facility] stated the coverage is approved and meds will be ready in 20 minutes, update given to the patient

## 2018-09-28 ENCOUNTER — Encounter: Payer: Self-pay | Admitting: Infectious Diseases

## 2018-09-30 ENCOUNTER — Ambulatory Visit: Payer: Self-pay | Admitting: Licensed Clinical Social Worker

## 2018-09-30 ENCOUNTER — Ambulatory Visit: Payer: Self-pay | Admitting: Family Medicine

## 2018-10-01 ENCOUNTER — Emergency Department (HOSPITAL_COMMUNITY)
Admission: EM | Admit: 2018-10-01 | Discharge: 2018-10-01 | Disposition: A | Discharge status: Home / Self Care | Payer: Self-pay | Attending: Emergency Medicine | Admitting: Emergency Medicine

## 2018-10-01 ENCOUNTER — Encounter (HOSPITAL_COMMUNITY): Payer: Self-pay | Admitting: Emergency Medicine

## 2018-10-01 ENCOUNTER — Other Ambulatory Visit: Payer: Self-pay

## 2018-10-01 DIAGNOSIS — E876 Hypokalemia: Secondary | ICD-10-CM | POA: Insufficient documentation

## 2018-10-01 DIAGNOSIS — I10 Essential (primary) hypertension: Secondary | ICD-10-CM | POA: Insufficient documentation

## 2018-10-01 DIAGNOSIS — Z79899 Other long term (current) drug therapy: Secondary | ICD-10-CM | POA: Insufficient documentation

## 2018-10-01 DIAGNOSIS — B20 Human immunodeficiency virus [HIV] disease: Secondary | ICD-10-CM | POA: Insufficient documentation

## 2018-10-01 DIAGNOSIS — F17210 Nicotine dependence, cigarettes, uncomplicated: Secondary | ICD-10-CM | POA: Insufficient documentation

## 2018-10-01 DIAGNOSIS — R42 Dizziness and giddiness: Secondary | ICD-10-CM | POA: Insufficient documentation

## 2018-10-01 LAB — BASIC METABOLIC PANEL
ANION GAP: 9 (ref 5–15)
BUN: 16 mg/dL (ref 6–20)
CO2: 24 mmol/L (ref 22–32)
Calcium: 9.5 mg/dL (ref 8.9–10.3)
Chloride: 104 mmol/L (ref 98–111)
Creatinine, Ser: 0.98 mg/dL (ref 0.44–1.00)
Glucose, Bld: 93 mg/dL (ref 70–99)
Potassium: 3.3 mmol/L — ABNORMAL LOW (ref 3.5–5.1)
SODIUM: 137 mmol/L (ref 135–145)

## 2018-10-01 LAB — CBC WITH DIFFERENTIAL/PLATELET
Abs Immature Granulocytes: 0.04 10*3/uL (ref 0.00–0.07)
BASOS ABS: 0 10*3/uL (ref 0.0–0.1)
BASOS PCT: 0 %
EOS ABS: 0.1 10*3/uL (ref 0.0–0.5)
Eosinophils Relative: 1 %
HCT: 35.9 % — ABNORMAL LOW (ref 36.0–46.0)
Hemoglobin: 11.7 g/dL — ABNORMAL LOW (ref 12.0–15.0)
IMMATURE GRANULOCYTES: 1 %
Lymphocytes Relative: 38 %
Lymphs Abs: 3.3 10*3/uL (ref 0.7–4.0)
MCH: 28.8 pg (ref 26.0–34.0)
MCHC: 32.6 g/dL (ref 30.0–36.0)
MCV: 88.4 fL (ref 80.0–100.0)
Monocytes Absolute: 0.7 10*3/uL (ref 0.1–1.0)
Monocytes Relative: 8 %
NEUTROS PCT: 52 %
NRBC: 0 % (ref 0.0–0.2)
Neutro Abs: 4.7 10*3/uL (ref 1.7–7.7)
PLATELETS: 289 10*3/uL (ref 150–400)
RBC: 4.06 MIL/uL (ref 3.87–5.11)
RDW: 13.3 % (ref 11.5–15.5)
WBC: 8.8 10*3/uL (ref 4.0–10.5)

## 2018-10-01 LAB — URINALYSIS, ROUTINE W REFLEX MICROSCOPIC
Bilirubin Urine: NEGATIVE
Glucose, UA: NEGATIVE mg/dL
Ketones, ur: NEGATIVE mg/dL
Leukocytes, UA: NEGATIVE
Nitrite: NEGATIVE
Protein, ur: NEGATIVE mg/dL
SPECIFIC GRAVITY, URINE: 1.018 (ref 1.005–1.030)
pH: 6 (ref 5.0–8.0)

## 2018-10-01 LAB — PREGNANCY, URINE: PREG TEST UR: NEGATIVE

## 2018-10-01 MED ORDER — SERTRALINE HCL 50 MG PO TABS: 50.0000 mg | ORAL_TABLET | Freq: Every day | ORAL | 0 refills | Status: DC

## 2018-10-01 MED ORDER — SODIUM CHLORIDE 0.9 % IV BOLUS
1000.0000 mL | Freq: Once | INTRAVENOUS | Status: AC
  Administered 2018-10-01: 1000 mL via INTRAVENOUS

## 2018-10-01 MED ORDER — POTASSIUM CHLORIDE CRYS ER 20 MEQ PO TBCR
20.0000 meq | EXTENDED_RELEASE_TABLET | Freq: Once | ORAL | Status: AC
  Administered 2018-10-01: 20 meq via ORAL
  Filled 2018-10-01: qty 1

## 2018-10-01 NOTE — Discharge Instructions (Signed)
You were evaluated in the emergency department for some lightheadedness and feeling fatigued.  Your lab work did not show any obvious explanation for your symptoms.  You have been out of your Zoloft for a week and so we are providing her prescription for this.  It is been sent to the pharmacy.  Please follow-up with your doctor.  Return if any concerns.

## 2018-10-01 NOTE — ED Provider Notes (Signed)
[facility]-EMERGENCY DEPT Provider Note   CSN: [id number] Arrival date & time: 10/01/18  1937     History   Chief Complaint Chief Complaint  Patient presents with  . Dizziness    HPI Sarah Watkins is a 37 y.o. female.  She has a history of HIV that is well controlled and her blood counts are undetectable.  She is complaining of 3 days of feeling fatigued and lightheaded when standing or moving too fast.  She feels she has been well hydrated and has not had a fever cough abdominal pain nausea vomiting diarrhea or urinary symptoms.  She ran out of her Zoloft about a week ago.  There is no headache no blurred vision no double vision.  She also has noted that she has intermittent tingling in her hands that is been going on for a few months.  She notes that if she shakes them the feeling will come back.  Her doctor at the ID clinic is aware of this.  The history is provided by the patient.  Dizziness  Quality:  Lightheadedness Severity:  Moderate Onset quality:  Gradual Duration:  3 days Timing:  Intermittent Progression:  Unchanged Chronicity:  New Context: head movement and standing up   Context: not with loss of consciousness   Relieved by:  Being still and change in position Worsened by:  Standing up Ineffective treatments:  None tried Associated symptoms: no blood in stool, no chest pain, no diarrhea, no headaches, no nausea, no shortness of breath, no syncope, no tinnitus, no vision changes and no vomiting   Risk factors: no heart disease and no hx of stroke     Past Medical History:  Diagnosis Date  . Hepatitis B immune 07/28/2016  . HIV (human immunodeficiency virus infection) (HCC) 01/2013  . HTN (hypertension)   . Obesity (BMI 30.0-34.9)     Patient Active Problem List   Diagnosis Date Noted  . Major depressive disorder, recurrent episode, severe (HCC) 03/06/2018  . Essential hypertension 04/29/2017  . Hepatitis B immune 07/28/2016  .  Influenza-like illness 11/09/2013  . HIV disease (HCC) 10/05/2013  . Dyslipidemia 10/05/2013  . Morbid obesity (HCC) 10/05/2013  . Cigarette smoker 10/05/2013  . Adjustment disorder with mixed anxiety and depressed mood 08/31/2013    Past Surgical History:  Procedure Laterality Date  . CESAREAN SECTION       OB History    Gravida  2   Para  1   Term  1   Preterm      AB  1   Living  1     SAB      TAB      Ectopic  1   Multiple      Live Births  1            Home Medications    Prior to Admission medications   Medication Sig Start Date End Date Taking? Authorizing Provider  elvitegravir-cobicistat-emtricitabine-tenofovir (GENVOYA) 150-150-200-10 MG TABS tablet Take 1 tablet by mouth daily with breakfast. For HIV 04/12/18   Ginnie Smart, MD  GENVOYA 150-150-200-10 MG TABS tablet TAKE 1 TABLET BY MOUTH ONCE DAILY WITH BREAKFAST 09/10/18   Ginnie Smart, MD  hydrochlorothiazide (HYDRODIURIL) 25 MG tablet TAKE 1 TABLET(25 MG) BY MOUTH DAILY 06/21/18   Ginnie Smart, MD  hydrOXYzine (ATARAX/VISTARIL) 25 MG tablet Take 1 tablet (25 mg total) by mouth 3 (three) times daily as needed (mild/moderate anxiety). 03/12/18   Armandina Stammer  I, NP  OLANZapine (ZYPREXA) 10 MG tablet Take 1 tablet (10 mg total) by mouth at bedtime. For mood control 03/12/18   Armandina Stammer I, NP  sertraline (ZOLOFT) 50 MG tablet Take 1 tablet (50 mg total) by mouth daily. For depression 04/14/18   Ginnie Smart, MD  traZODone (DESYREL) 50 MG tablet Take 1 tablet (50 mg total) by mouth at bedtime as needed for sleep. 03/12/18   Sanjuana Kava, NP    Family History Family History  Problem Relation Age of Onset  . Hypertension Mother     Social History Social History   Tobacco Use  . Smoking status: Current Every Day Smoker    Packs/day: 0.30    Types: Cigarettes  . Smokeless tobacco: Never Used  Substance Use Topics  . Alcohol use: Yes    Comment: occ, social, less than  weekly  . Drug use: Yes    Frequency: 7.0 times per week    Types: Marijuana    Comment: marijuana/ rare     Allergies   Sulfa antibiotics   Review of Systems Review of Systems  Constitutional: Negative for fever.  HENT: Negative for sore throat and tinnitus.   Eyes: Negative for visual disturbance.  Respiratory: Negative for shortness of breath.   Cardiovascular: Negative for chest pain and syncope.  Gastrointestinal: Negative for abdominal pain, blood in stool, diarrhea, nausea and vomiting.  Genitourinary: Negative for dysuria.  Musculoskeletal: Negative for neck pain.  Skin: Negative for rash.  Neurological: Positive for dizziness and numbness. Negative for headaches.     Physical Exam Updated Vital Signs BP 123/69 (BP Location: Left Arm)   Pulse 82   Temp 99 F (37.2 C) (Oral)   Resp 16   LMP 09/17/2018   SpO2 99%   Physical Exam  Constitutional: She is oriented to person, place, and time. She appears well-developed and well-nourished. No distress.  HENT:  Head: Normocephalic and atraumatic.  Eyes: Pupils are equal, round, and reactive to light. Conjunctivae and EOM are normal.  Is slight proptosis of the left eye.  Neck: Normal range of motion. Neck supple.  Cardiovascular: Normal rate and regular rhythm.  No murmur heard. Pulmonary/Chest: Effort normal and breath sounds normal. No respiratory distress.  Abdominal: Soft. There is no tenderness. There is no guarding.  Musculoskeletal: She exhibits no edema.  Neurological: She is alert and oriented to person, place, and time. She has normal strength. No sensory deficit. Gait normal. GCS eye subscore is 4. GCS verbal subscore is 5. GCS motor subscore is 6.  Skin: Skin is warm and dry.  Psychiatric: She has a normal mood and affect.  Nursing note and vitals reviewed.    ED Treatments / Results  Labs (all labs ordered are listed, but only abnormal results are displayed) Labs Reviewed  BASIC METABOLIC PANEL  - Abnormal; Notable for the following components:      Result Value   Potassium 3.3 (*)    All other components within normal limits  CBC WITH DIFFERENTIAL/PLATELET - Abnormal; Notable for the following components:   Hemoglobin 11.7 (*)    HCT 35.9 (*)    All other components within normal limits  URINALYSIS, ROUTINE W REFLEX MICROSCOPIC - Abnormal; Notable for the following components:   APPearance CLOUDY (*)    Hgb urine dipstick SMALL (*)    Bacteria, UA RARE (*)    All other components within normal limits  PREGNANCY, URINE  HEMOGLOBIN A1C    EKG EKG  Interpretation  Date/Time:  Friday October 01 2018 21:16:16 EDT Ventricular Rate:  67 PR Interval:    QRS Duration: 99 QT Interval:  416 QTC Calculation: 440 R Axis:   61 Text Interpretation:  Sinus arrhythmia Low voltage, precordial leads Nonspecific T abnormalities, anterior leads no stemi Confirmed by Alona Bene [phone]) on 10/01/2018 9:22:30 PM   Radiology No results found.  Procedures Procedures (including critical care time)  Medications Ordered in ED Medications  sodium chloride 0.9 % bolus 1,000 mL (0 mLs Intravenous Stopped 10/01/18 2220)  potassium chloride SA (K-DUR,KLOR-CON) CR tablet 20 mEq (20 mEq Oral Given 10/01/18 2200)     Initial Impression / Assessment and Plan / ED Course  I have reviewed the triage vital signs and the nursing notes.  Pertinent labs & imaging results that were available during my care of the patient were reviewed by me and considered in my medical decision making (see chart for details).  Clinical Course as of Oct 02 2327  Caleen Essex Oct 01, 2018  2247 reViewed patient's lab with her and she is comfortable with discharge.  She understands to follow-up with her primary care doctor.   [MB]    Clinical Course User Index [MB] Terrilee Files, MD     Final Clinical Impressions(s) / ED Diagnoses   Final diagnoses:  Lightheadedness  Hypokalemia    ED Discharge Orders          Ordered    sertraline (ZOLOFT) 50 MG tablet  Daily     10/01/18 2140           Terrilee Files, MD 10/01/18 2329

## 2018-10-01 NOTE — ED Triage Notes (Signed)
Patient reports intermittent dizziness with movement and fatigue x3 days. Reports intermittent tingling to bilateral hands x3 months. Denies blurred vision, weakness, abdominal pain, chest pain, N/V/D. Hx HIV. Reports close follow up with ID and taking meds as prescribed. Does state she has been out of zoloft x1 week.

## 2018-10-02 LAB — HEMOGLOBIN A1C
HEMOGLOBIN A1C: 5.6 % (ref 4.8–5.6)
MEAN PLASMA GLUCOSE: 114.02 mg/dL

## 2018-10-08 ENCOUNTER — Ambulatory Visit (INDEPENDENT_AMBULATORY_CARE_PROVIDER_SITE_OTHER): Payer: Self-pay | Admitting: Licensed Clinical Social Worker

## 2018-10-08 DIAGNOSIS — F331 Major depressive disorder, recurrent, moderate: Secondary | ICD-10-CM

## 2018-10-08 NOTE — BH Specialist Note (Signed)
Integrated Behavioral Health Follow Up Visit  MRN: [id number] Name: Sarah Watkins  Number of Integrated Behavioral Health Clinician visits: 5/6 Session Start time: 9:00am  Session End time: 9:40am Total time: 40 minutes  Type of Service: Integrated Behavioral Health- Individual/Family Interpretor:No. Interpretor Name and Language: n/a  SUBJECTIVE: Sarah Watkins is a 37 y.o. female accompanied by self Patient reports the following symptoms/concerns: isolating, irritability, no enjoyment, no motivation, staying in her room all the time  OBJECTIVE: Mood: Depressed and Affect: Constricted Risk of harm to self or others: No plan to harm self or others  LIFE CONTEXT: Patient reports she did not go to son's awards day yesterday because she couldn't stand the thought of being around so many people; her mother went in her place. She states she has been thinking of moving away to get a fresh start and leaving son with her mother, but when she mentioned it to mother she brushed it off. Patient states that she rarely leaves her room, and that she constantly comes up with reasons not to go and do things with people. 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 This time, instead of not returning to the job patient called her manager and reported that she could no longer do the job.   GOALS ADDRESSED: Patient will: 1.  Reduce symptoms of: depression   INTERVENTIONS: Interventions utilized:  Brief CBT and Supportive Counseling  ASSESSMENT: Patient currently experiencingisolating, irritability, no enjoyment, no motivation, staying in her room all the time. Counselor processed with her the feelings that have led her to want to remain alone in her room. Patient expressed feeling overwhelmed and unable to handle life. Counselor guided patient to identify what moving away without son would change for her. Patient talked  about a new atmosphere and not having the responsibilities that she currently does. Counselor used paradoxical interventions to help patient recognize that not having responsibility for her son's wellbeing may also lead to her doing less in life, rather than freeing her to do more. Counselor reminded patient of the saying "Wherever I go, there I am," and encouraged her to explore the internal components of her depression. Patient stated that she does not care about anything right now, and counselor processed this apathy with her. Over the conversation, patient pointed out that she does not care about much, but she does still care about herself and her son.  Counselor and patient used functional analysis to identify ways that she recognizes care about herself and son, such as taking her medication as prescribed showing that she cares for her health. Counselor used this information to guide patient in making a plan for small things she can begin doing to help align her actions with the values of her own wellbeing and relationships with her family members. Patient identified that she can come out of her room for son to read to her before bed each night, and that she can communicate to mom (via note) that she would like to spend at least half an hour with her daily but is not ready to talk about her depression during that time frame. Counselor commended patient for coming up with these and other action items, and emphasized the importance of nonjudgemental stance when attempting these things.  Patient may benefit from ongoing sessions of CBT and reality therapy.  PLAN: 1. Follow up with behavioral health clinician on : 10/14/18  Angus Palms, LCSW

## 2018-10-14 ENCOUNTER — Ambulatory Visit: Payer: Self-pay | Admitting: Licensed Clinical Social Worker

## 2018-11-10 ENCOUNTER — Ambulatory Visit (INDEPENDENT_AMBULATORY_CARE_PROVIDER_SITE_OTHER): Payer: Self-pay | Admitting: Licensed Clinical Social Worker

## 2018-11-10 ENCOUNTER — Other Ambulatory Visit: Payer: Self-pay | Admitting: Behavioral Health

## 2018-11-10 DIAGNOSIS — F331 Major depressive disorder, recurrent, moderate: Secondary | ICD-10-CM

## 2018-11-10 MED ORDER — SERTRALINE HCL 50 MG PO TABS: 50.0000 mg | ORAL_TABLET | Freq: Every day | ORAL | 1 refills | Status: DC

## 2018-11-10 NOTE — Progress Notes (Signed)
Integrated Behavioral Health Comprehensive Clinical Assessment  MRN: [id number] Name: Sarah Watkins  Session Time: 2:00pm - 2:34pm Total time: 30 minutes  Type of Service: Integrated Behavioral Health-Individual Interpretor: No. Interpretor Name and Language: n/a  PRESENTING CONCERNS: Sarah Watkins is a 37 y.o. female accompanied by self. Braylyn Leone PayorS Napoleon [facility] depressive symptoms.  Previous mental health services Have you ever been treated for a mental health problem? Yes If "Yes", when were you treated and whom did you see? [facility] Have you ever been hospitalized for mental health treatment? Yes Have you ever been treated for any of the following? Past Psychiatric History/Hospitalization(s): Anxiety: No Bipolar Disorder: No Depression: Yes Mania: No Psychosis: No Schizophrenia: No     Personality Disorder: No Hospitalization for psychiatric illness: Yes History of Electroconvulsive Shock Therapy: No Prior Suicide Attempts: No Have you ever had thoughts of harming yourself or others or attempted suicide? No plan to harm self or others  Medical history  has a past medical history of Hepatitis B immune (07/28/2016), HIV (human immunodeficiency virus infection) (HCC) (01/2013), HTN (hypertension), and Obesity (BMI 30.0-34.9). Primary Care Physician: Patient, No Pcp Per Date of last physical exam:  Allergies:  Allergies  Allergen Reactions  . Sulfa Antibiotics Hives, Itching, Swelling, Rash and Other (See Comments)    Various gland swelling   Current medications:  Outpatient Encounter Medications as of 11/10/2018  Medication Sig  . acetaminophen (TYLENOL) 500 MG tablet Take 500 mg by mouth every 6 (six) hours as needed for mild pain.  Marland Kitchen. elvitegravir-cobicistat-emtricitabine-tenofovir (GENVOYA) 150-150-200-10 MG TABS tablet Take 1 tablet by mouth daily with breakfast.  For HIV (Patient not taking: Reported on 10/01/2018)  . GENVOYA 150-150-200-10 MG TABS tablet TAKE 1 TABLET BY MOUTH ONCE DAILY WITH BREAKFAST (Patient taking differently: Take 1 tablet by mouth daily with breakfast. )  . hydrochlorothiazide (HYDRODIURIL) 25 MG tablet TAKE 1 TABLET(25 MG) BY MOUTH DAILY (Patient taking differently: Take 25 mg by mouth daily. )  . hydrOXYzine (ATARAX/VISTARIL) 25 MG tablet Take 1 tablet (25 mg total) by mouth 3 (three) times daily as needed (mild/moderate anxiety). (Patient not taking: Reported on 10/01/2018)  . OLANZapine (ZYPREXA) 10 MG tablet Take 1 tablet (10 mg total) by mouth at bedtime. For mood control (Patient taking differently: Take 10 mg by mouth once a week. For mood control)  . traZODone (DESYREL) 50 MG tablet Take 1 tablet (50 mg total) by mouth at bedtime as needed for sleep.  . [DISCONTINUED] sertraline (ZOLOFT) 50 MG tablet Take 1 tablet (50 mg total) by mouth daily. For depression   No facility-administered encounter medications on file as of 11/10/2018.    Have you ever had any serious medication reactions? No Is there any history of mental health problems or substance abuse in your family? No Has anyone in your family been hospitalized for mental health treatment? No  Social/family history Who lives in your current household? Self, 39 year old son, mother and stepfather What is your family of origin, childhood history? Grew up with mother and siblings, "mostly a normal childhood" Do you have siblings, step/half siblings? Yes- brother and 2 nieces Are your parents separated or divorced? Yes- mom has been remarried to stepdad for many years What are your social supports? Mom and stepdad  Education How many grades have you completed? High school Did you have any problems in school? 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 Her depression keeps her from being able to motivate herself for work, and her anxiety  Overwhelms her when she does go. For at least one job she sat in the parking garage on her first day but could not make herself get out of the car.   Sleep Takes Trazadone to help with sleep  Trauma/Abuse history Have you ever experienced or been exposed to any form of abuse? No Have you ever experienced or been exposed to something traumatic? No  Substance use Do you use alcohol, nicotine or caffeine? alcohol intake:social drinker How old were you when you first tasted alcohol? Does not remember Have you ever used illicit drugs or abused prescription medications? none  Mental status General appearance/Behavior: Casual Eye contact: Fair Motor behavior: Normal Speech: Normal Level of consciousness: Alert Mood: Depressed Affect: Depressed Anxiety level: Moderate Thought process: Perseverating Thought content: Rumination Perception: Normal Judgment: Poor Insight: Present  Diagnosis Major Depressive Disorder, Recurrent Episode, Moderate  GOALS ADDRESSED: Patient will reduce symptoms of: depression   INTERVENTIONS: Interventions utilized: Motivational Interviewing, Brief CBT and Supportive Counseling  ASSESSMENT/OUTCOME: Patient reports that she had a period of increased anxiety and isolation over Thanksgiving. She describes becoming overwhelmed and retreating to her room when everyone was around. Patient also shares that her mother has been engaging her in activities and insists not only on patient going with her but also talking about patient's progress. Counselor explored with patient her thoughts during her overwhelmed episodes, and suggested that patient may have been feeling overstimulated, since she has been keeping to herself so much and was suddenly exposed to a  considerably large group of people. Patient shared that she feels she "owes it" to mom to go to these events and activities because of all that mom has done for her. Counselor normalized this feeling, and pointed out that patient cannot possibly give what she does not have. Counselor guided patient in processing her thoughts and feelings about attending family event this weekend. Patient states that her mother and aunt have been asking her to come, but that she does not feel like it would be good for her. Counselor empowered patient to set her own limits on what is best for her. Counselor pointed out that if patient does decide to go to the event, she can still make good decisions for herself by planning/ creating expectations, such as how long she is going to stay, and provide support for herself by having something that is good for her to do afterward. Patient identified feelings of guilt for decisions she has made in the past. Counselor assisted patient in processing those decisions and used paradoxical interventions to help client identify how excessive her sense of responsibility and guilt is in these areas.   PLAN: Continue weekly sessions of CBT for depression/anxiety.   Scheduled next visit: 11/15/18  Reita Cliche Counselor

## 2018-11-16 ENCOUNTER — Telehealth (HOSPITAL_COMMUNITY): Payer: Self-pay | Admitting: *Deleted

## 2018-11-16 NOTE — Telephone Encounter (Signed)
Attempted to call patient to remind her to reschedule her colposcopy appointment and explain the importance of follow-up. No one answered the phone. Left voicemail for patient to call me back.

## 2018-11-17 ENCOUNTER — Ambulatory Visit: Payer: Self-pay | Admitting: Licensed Clinical Social Worker

## 2018-11-22 ENCOUNTER — Ambulatory Visit: Payer: Self-pay | Admitting: Licensed Clinical Social Worker

## 2018-12-07 ENCOUNTER — Other Ambulatory Visit: Payer: Self-pay

## 2018-12-07 DIAGNOSIS — Z79899 Other long term (current) drug therapy: Secondary | ICD-10-CM

## 2018-12-07 DIAGNOSIS — Z113 Encounter for screening for infections with a predominantly sexual mode of transmission: Secondary | ICD-10-CM

## 2018-12-07 DIAGNOSIS — B20 Human immunodeficiency virus [HIV] disease: Secondary | ICD-10-CM

## 2018-12-08 LAB — URINE CYTOLOGY ANCILLARY ONLY
Chlamydia: NEGATIVE
Neisseria Gonorrhea: NEGATIVE

## 2018-12-08 LAB — T-HELPER CELL (CD4) - (RCID CLINIC ONLY)
CD4 % Helper T Cell: 27 % — ABNORMAL LOW (ref 33–55)
CD4 T CELL ABS: 610 /uL (ref 400–2700)

## 2018-12-09 LAB — LIPID PANEL
CHOL/HDL RATIO: 6.6 (calc) — AB (ref <5.0)
CHOLESTEROL: 265 mg/dL — AB (ref <200)
HDL: 40 mg/dL — AB (ref >50)
LDL Cholesterol (Calc): 200 mg/dL (calc) — ABNORMAL HIGH
Non-HDL Cholesterol (Calc): 225 mg/dL (calc) — ABNORMAL HIGH (ref <130)
Triglycerides: 112 mg/dL (ref <150)

## 2018-12-09 LAB — RPR: RPR: REACTIVE — AB

## 2018-12-09 LAB — CBC
HCT: 39.3 % (ref 35.0–45.0)
Hemoglobin: 13.5 g/dL (ref 11.7–15.5)
MCH: 29.4 pg (ref 27.0–33.0)
MCHC: 34.4 g/dL (ref 32.0–36.0)
MCV: 85.6 fL (ref 80.0–100.0)
MPV: 10.8 fL (ref 7.5–12.5)
Platelets: 259 10*3/uL (ref 140–400)
RBC: 4.59 10*6/uL (ref 3.80–5.10)
RDW: 14.2 % (ref 11.0–15.0)
WBC: 7.8 10*3/uL (ref 3.8–10.8)

## 2018-12-09 LAB — COMPREHENSIVE METABOLIC PANEL
AG RATIO: 1.5 (calc) (ref 1.0–2.5)
ALBUMIN MSPROF: 4.4 g/dL (ref 3.6–5.1)
ALKALINE PHOSPHATASE (APISO): 81 U/L (ref 33–115)
ALT: 9 U/L (ref 6–29)
AST: 14 U/L (ref 10–30)
BILIRUBIN TOTAL: 0.5 mg/dL (ref 0.2–1.2)
BUN: 13 mg/dL (ref 7–25)
CALCIUM: 9.6 mg/dL (ref 8.6–10.2)
CHLORIDE: 101 mmol/L (ref 98–110)
CO2: 27 mmol/L (ref 20–32)
CREATININE: 0.9 mg/dL (ref 0.50–1.10)
GLOBULIN: 3 g/dL (ref 1.9–3.7)
Glucose, Bld: 86 mg/dL (ref 65–99)
POTASSIUM: 3.8 mmol/L (ref 3.5–5.3)
Sodium: 137 mmol/L (ref 135–146)
TOTAL PROTEIN: 7.4 g/dL (ref 6.1–8.1)

## 2018-12-09 LAB — FLUORESCENT TREPONEMAL AB(FTA)-IGG-BLD: Fluorescent Treponemal ABS: REACTIVE — AB

## 2018-12-09 LAB — RPR TITER: RPR Titer: 1:2 {titer} — ABNORMAL HIGH

## 2018-12-09 LAB — HIV-1 RNA QUANT-NO REFLEX-BLD
HIV 1 RNA Quant: <20 copies/mL
HIV-1 RNA Quant, Log: <1.3 Log copies/mL

## 2018-12-12 ENCOUNTER — Other Ambulatory Visit: Payer: Self-pay | Admitting: Infectious Diseases

## 2018-12-12 DIAGNOSIS — F331 Major depressive disorder, recurrent, moderate: Secondary | ICD-10-CM

## 2018-12-12 DIAGNOSIS — I10 Essential (primary) hypertension: Secondary | ICD-10-CM

## 2018-12-21 ENCOUNTER — Encounter: Payer: Self-pay | Admitting: Infectious Diseases

## 2018-12-21 ENCOUNTER — Ambulatory Visit: Payer: Self-pay | Admitting: Infectious Diseases

## 2018-12-23 ENCOUNTER — Ambulatory Visit (INDEPENDENT_AMBULATORY_CARE_PROVIDER_SITE_OTHER): Payer: Self-pay | Admitting: Infectious Diseases

## 2018-12-23 ENCOUNTER — Ambulatory Visit: Payer: Self-pay | Admitting: Infectious Diseases

## 2018-12-23 ENCOUNTER — Encounter: Payer: Self-pay | Admitting: Infectious Diseases

## 2018-12-23 VITALS — BP 107/70 | HR 82 | Temp 98.3°F | Wt 244.0 lb

## 2018-12-23 DIAGNOSIS — Z79899 Other long term (current) drug therapy: Secondary | ICD-10-CM

## 2018-12-23 DIAGNOSIS — F17210 Nicotine dependence, cigarettes, uncomplicated: Secondary | ICD-10-CM

## 2018-12-23 DIAGNOSIS — I10 Essential (primary) hypertension: Secondary | ICD-10-CM

## 2018-12-23 DIAGNOSIS — Z113 Encounter for screening for infections with a predominantly sexual mode of transmission: Secondary | ICD-10-CM

## 2018-12-23 DIAGNOSIS — B20 Human immunodeficiency virus [HIV] disease: Secondary | ICD-10-CM

## 2018-12-23 DIAGNOSIS — F4323 Adjustment disorder with mixed anxiety and depressed mood: Secondary | ICD-10-CM

## 2018-12-23 DIAGNOSIS — Z23 Encounter for immunization: Secondary | ICD-10-CM

## 2018-12-23 DIAGNOSIS — F331 Major depressive disorder, recurrent, moderate: Secondary | ICD-10-CM

## 2018-12-23 MED ORDER — SERTRALINE HCL 50 MG PO TABS: ORAL_TABLET | ORAL | 0 refills | Status: DC

## 2018-12-23 MED ORDER — ELVITEG-COBIC-EMTRICIT-TENOFAF 150-150-200-10 MG PO TABS: 1.0000 | ORAL_TABLET | Freq: Every day | ORAL | 3 refills | Status: AC

## 2018-12-23 MED ORDER — HYDROXYZINE HCL 25 MG PO TABS: 25.0000 mg | ORAL_TABLET | Freq: Three times a day (TID) | ORAL | 0 refills | Status: AC | PRN

## 2018-12-23 NOTE — Progress Notes (Signed)
   Subjective:    Patient ID: Kenna Gilbert, female    DOB: January 18, 1981, 38 y.o.   MRN: [id number]  HPI 38 yo F with HIV+ since 2014. Has been on genvoya.  Prev had IUD. Has 40 yo son, gets help from mom.  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  Was hospitalized at [facility] 03-2018. Still working on depression. Still taking zoloft, out of atarax. Has been seeing Rene Kocher, last visit 11-2019. May try going to [facility]. Previously CNA,  currently out of work.  Is trying keto diet to try to lose wt.  Has occas missed her ART. Goes to bed and forgets. Does not take at same time everyday.   Had PAP 05-2018- [facility]. Sent to [facility], deferred per pt.   HIV 1 RNA Quant (copies/mL)  Date Value  12/07/2018 <20 NOT DETECTED  05/26/2018 <20 NOT DETECTED  12/07/2017 <20 NOT DETECTED   CD4 T Cell Abs (/uL)  Date Value  12/07/2018 610  05/26/2018 1,140  12/07/2017 770    Review of Systems  Constitutional: Negative for appetite change and unexpected weight change.  Gastrointestinal: Negative for constipation and diarrhea.  Genitourinary: Negative for difficulty urinating.  Psychiatric/Behavioral: Positive for dysphoric mood.  Please see HPI. All other systems reviewed and negative.     Objective:   Physical Exam Constitutional:      Appearance: Normal appearance.  HENT:     Mouth/Throat:     Mouth: Mucous membranes are moist.     Pharynx: No oropharyngeal exudate.  Eyes:     Extraocular Movements: Extraocular movements intact.     Pupils: Pupils are equal, round, and reactive to light.  Neck:     Musculoskeletal: Normal range of motion and neck supple.  Cardiovascular:     Rate and Rhythm: Normal rate and regular rhythm.  Pulmonary:     Effort: Pulmonary effort is normal.     Breath sounds: Normal breath sounds.  Abdominal:     General: Bowel sounds are normal.     Palpations: Abdomen is soft.  Musculoskeletal:        General: No swelling.  Neurological:     Mental  Status: She is alert.  Psychiatric:        Mood and Affect: Mood normal.        Assessment & Plan:

## 2018-12-23 NOTE — Assessment & Plan Note (Signed)
She is doing well I encouraged her to take her rx at the same time everyday.  Offered/refused condoms.  Gets flu vax today. Has gotten PCV.  rtc in 9 months.

## 2018-12-23 NOTE — Assessment & Plan Note (Signed)
Encouraged her diet and exercise plan.

## 2018-12-23 NOTE — Assessment & Plan Note (Signed)
Encouraged her to quit 

## 2018-12-23 NOTE — Assessment & Plan Note (Signed)
Well controlled on HCTZ 

## 2018-12-23 NOTE — Assessment & Plan Note (Signed)
She is going to f/u with [facility].  rx's refilled.

## 2019-01-19 ENCOUNTER — Encounter: Payer: Self-pay | Admitting: Infectious Diseases

## 2019-01-27 ENCOUNTER — Other Ambulatory Visit: Payer: Self-pay | Admitting: Infectious Diseases

## 2019-01-27 DIAGNOSIS — I10 Essential (primary) hypertension: Secondary | ICD-10-CM

## 2019-02-24 ENCOUNTER — Other Ambulatory Visit: Payer: Self-pay | Admitting: Infectious Diseases

## 2019-02-24 DIAGNOSIS — F331 Major depressive disorder, recurrent, moderate: Secondary | ICD-10-CM

## 2019-03-20 ENCOUNTER — Other Ambulatory Visit: Payer: Self-pay | Admitting: Infectious Diseases

## 2019-03-20 DIAGNOSIS — F331 Major depressive disorder, recurrent, moderate: Secondary | ICD-10-CM

## 2019-06-24 ENCOUNTER — Other Ambulatory Visit: Payer: Self-pay | Admitting: Infectious Diseases

## 2019-06-24 DIAGNOSIS — I10 Essential (primary) hypertension: Secondary | ICD-10-CM

## 2019-06-28 ENCOUNTER — Telehealth: Payer: Self-pay

## 2019-06-28 NOTE — Telephone Encounter (Signed)
Reached out to patient regarding message for STD exposure. Advised patient to reach out to to local health department or go to urgent care for STD exposure. Patient verbalized understanding.  [facility]

## 2019-06-28 NOTE — Telephone Encounter (Signed)
-----   Message from [place] sent at 06/28/2019 10:40 AM EDT ----- Regarding: Testing Contact: SELF- [phone] Patient called to schedule appointment, but appointments had already been scheduled. Then patient proceeded to ask me if she can get STD testing here because her sexual partner just informed her that he tested positive for Chlamydia. Claims to have had 2 sexual encounters in the last week.

## 2019-09-09 ENCOUNTER — Other Ambulatory Visit: Payer: Self-pay

## 2019-09-26 ENCOUNTER — Encounter: Payer: Self-pay | Admitting: Infectious Diseases

## 2019-09-26 ENCOUNTER — Ambulatory Visit (INDEPENDENT_AMBULATORY_CARE_PROVIDER_SITE_OTHER): Payer: Self-pay | Admitting: Infectious Diseases

## 2019-09-26 ENCOUNTER — Telehealth: Payer: Self-pay | Admitting: Pharmacy Technician

## 2019-09-26 ENCOUNTER — Other Ambulatory Visit: Payer: Self-pay

## 2019-09-26 VITALS — BP 116/78 | HR 73 | Temp 98.2°F | Wt 252.4 lb

## 2019-09-26 DIAGNOSIS — Z23 Encounter for immunization: Secondary | ICD-10-CM

## 2019-09-26 DIAGNOSIS — Z113 Encounter for screening for infections with a predominantly sexual mode of transmission: Secondary | ICD-10-CM

## 2019-09-26 DIAGNOSIS — F332 Major depressive disorder, recurrent severe without psychotic features: Secondary | ICD-10-CM

## 2019-09-26 DIAGNOSIS — N926 Irregular menstruation, unspecified: Secondary | ICD-10-CM

## 2019-09-26 DIAGNOSIS — E785 Hyperlipidemia, unspecified: Secondary | ICD-10-CM

## 2019-09-26 DIAGNOSIS — K089 Disorder of teeth and supporting structures, unspecified: Secondary | ICD-10-CM

## 2019-09-26 DIAGNOSIS — B20 Human immunodeficiency virus [HIV] disease: Secondary | ICD-10-CM

## 2019-09-26 NOTE — Assessment & Plan Note (Signed)
She will f/u with Gyn in [place] for this and possible removal of IUD.

## 2019-09-26 NOTE — Progress Notes (Signed)
   Subjective:    Patient ID: Sarah Watkins, female    DOB: 1981/03/09, 38 y.o.   MRN: [id number]  HPI 38 yo F with HIV+ since 2014. Has been on genvoya.  Prev had IUD, menses have been irregular. Has 69 yo son, gets help from mom.  Was hospitalized at [facility] 03-2018. Still working on depression. Still taking zoloft. Has been out of counseling.  Mood has been good, "I do have my moments".   Previously CNA,  currently out of work.  Was not able to keep up with Keto diet, has gained wt.  Is moving to [place].  Needs labs today as well as meds refilled with [facility].  Back with her HIV+ boyfriend, moving in together in [place].   HIV 1 RNA Quant (copies/mL)  Date Value  12/07/2018 <20 NOT DETECTED  05/26/2018 <20 NOT DETECTED  12/07/2017 <20 NOT DETECTED   CD4 T Cell Abs (/uL)  Date Value  12/07/2018 610  05/26/2018 1,140  12/07/2017 770    Review of Systems  Constitutional: Negative for appetite change and unexpected weight change.  Respiratory: Negative for cough and shortness of breath.   Gastrointestinal: Negative for constipation and diarrhea.  Genitourinary: Positive for menstrual problem. Negative for difficulty urinating.  Psychiatric/Behavioral: Negative for dysphoric mood.  Please see HPI. All other systems reviewed and negative.    Objective:   Physical Exam Constitutional:      Appearance: Normal appearance. She is obese.  HENT:     Mouth/Throat:     Mouth: Mucous membranes are moist.     Pharynx: No oropharyngeal exudate.  Eyes:     Extraocular Movements: Extraocular movements intact.     Pupils: Pupils are equal, round, and reactive to light.  Neck:     Musculoskeletal: Normal range of motion and neck supple.  Cardiovascular:     Rate and Rhythm: Normal rate and regular rhythm.  Pulmonary:     Effort: Pulmonary effort is normal.     Breath sounds: Normal breath sounds.  Abdominal:     General: Bowel sounds are normal. There is no distension.   Palpations: Abdomen is soft.     Tenderness: There is no abdominal tenderness.  Musculoskeletal:     Right lower leg: No edema.     Left lower leg: No edema.  Neurological:     General: No focal deficit present.     Mental Status: She is alert.  Psychiatric:        Mood and Affect: Mood normal.       Assessment & Plan:

## 2019-09-26 NOTE — Assessment & Plan Note (Signed)
We talked about plans for her to lose wt- More freq small meals.  Walking.

## 2019-09-26 NOTE — Telephone Encounter (Signed)
[facility] Patient Advocate Encounter    Findings of the benefits investigation:   Insurance: uninsured  Patient is moving to [place], [place] this Saturday and trying to get medication refilled before she moves. She is no longer covered by [employer] and did not reapply because she is moving to [place]. Her plan is to reestablish care at her old clinic once she gets settled.   I spoke with Philis Fendt on the phone to get her an immediate 30-day coverage but she had already used one within the last 365-days and would not be eligible until 09/29/2019. The representative on the phone suggested that I submit a full application with income documentation instead. That has been faxed today and patient is aware.   [facility] Patient Advocate will follow up and let the know once Philis Fendt makes a determination.  Bartholomew Crews, CPhT Specialty Pharmacy Patient [facility] Phone: [phone] Fax: [phone] 09/26/2019 1:15 PM

## 2019-09-26 NOTE — Assessment & Plan Note (Signed)
Labs today Flu shot U=U rtc as needed as she is moving Spoke with pharm and they will fix her refills

## 2019-09-26 NOTE — Assessment & Plan Note (Signed)
Will continue to watch, recheck today Hopefully will improve with her re-commitment to diet.

## 2019-09-26 NOTE — Assessment & Plan Note (Signed)
Continue on zoloft.  F/u with mental health in [place] prn.

## 2019-09-26 NOTE — Assessment & Plan Note (Signed)
She will f/u with dental in [place].

## 2019-09-27 LAB — T-HELPER CELL (CD4) - (RCID CLINIC ONLY)
CD4 % Helper T Cell: 43 % (ref 33–65)
CD4 T Cell Abs: 849 /uL (ref 400–1790)

## 2019-09-27 LAB — URINE CYTOLOGY ANCILLARY ONLY
Chlamydia: NEGATIVE
Comment: NEGATIVE
Comment: NORMAL
Neisseria Gonorrhea: NEGATIVE

## 2019-09-29 LAB — CBC
HCT: 39.2 % (ref 35.0–45.0)
Hemoglobin: 13.4 g/dL (ref 11.7–15.5)
MCH: 30.1 pg (ref 27.0–33.0)
MCHC: 34.2 g/dL (ref 32.0–36.0)
MCV: 88.1 fL (ref 80.0–100.0)
MPV: 10.7 fL (ref 7.5–12.5)
Platelets: 284 10*3/uL (ref 140–400)
RBC: 4.45 10*6/uL (ref 3.80–5.10)
RDW: 12.8 % (ref 11.0–15.0)
WBC: 6.6 10*3/uL (ref 3.8–10.8)

## 2019-09-29 LAB — COMPREHENSIVE METABOLIC PANEL
AG Ratio: 1.6 (calc) (ref 1.0–2.5)
ALT: 9 U/L (ref 6–29)
AST: 15 U/L (ref 10–30)
Albumin: 4.3 g/dL (ref 3.6–5.1)
Alkaline phosphatase (APISO): 78 U/L (ref 31–125)
BUN: 16 mg/dL (ref 7–25)
CO2: 26 mmol/L (ref 20–32)
Calcium: 9.7 mg/dL (ref 8.6–10.2)
Chloride: 104 mmol/L (ref 98–110)
Creat: 0.94 mg/dL (ref 0.50–1.10)
Globulin: 2.7 g/dL (calc) (ref 1.9–3.7)
Glucose, Bld: 97 mg/dL (ref 65–99)
Potassium: 3.5 mmol/L (ref 3.5–5.3)
Sodium: 139 mmol/L (ref 135–146)
Total Bilirubin: 0.3 mg/dL (ref 0.2–1.2)
Total Protein: 7 g/dL (ref 6.1–8.1)

## 2019-09-29 LAB — LIPID PANEL
Cholesterol: 250 mg/dL — ABNORMAL HIGH (ref <200)
HDL: 35 mg/dL — ABNORMAL LOW (ref >=50)
LDL Cholesterol (Calc): 183 mg/dL (calc) — ABNORMAL HIGH
Non-HDL Cholesterol (Calc): 215 mg/dL (calc) — ABNORMAL HIGH (ref <130)
Total CHOL/HDL Ratio: 7.1 (calc) — ABNORMAL HIGH (ref <5.0)
Triglycerides: 169 mg/dL — ABNORMAL HIGH (ref <150)

## 2019-09-29 LAB — RPR TITER: RPR Titer: 1:2 {titer} — ABNORMAL HIGH

## 2019-09-29 LAB — FLUORESCENT TREPONEMAL AB(FTA)-IGG-BLD: Fluorescent Treponemal ABS: REACTIVE — AB

## 2019-09-29 LAB — HIV-1 RNA QUANT-NO REFLEX-BLD
HIV 1 RNA Quant: <20 copies/mL
HIV-1 RNA Quant, Log: <1.3 Log copies/mL

## 2019-09-29 LAB — RPR: RPR Ser Ql: REACTIVE — AB

## 2019-10-15 IMAGING — CT CT ORBITS W/ CM
4 of 9 series · 16 of 47 positions shown, 18 images · IV contrast (iopamidol)
Comparison: Head CT 01/07/2018

CLINICAL DATA: Dizziness with bulging of the eye.

EXAM:
CT HEAD WITHOUT AND WITH CONTRAST
CT ORBITS WITH CONTRAST
TECHNIQUE: Multidetector CT imaging of the head was performed following the
standard protocol before and after bolus injection of intravenous
contrast. Multidetector CT imaging of the orbits were performed
following the standard protocol during bolus administration of
intravenous contrast.
CONTRAST:  75mL [id number] IOPAMIDOL (YDNNP3-WVV) INJECTION 61%

[Series 3: head 5.0 h30s · axial · 0.44mm/px · z∈[-73,+7]mm · 5 of 34 slices shown (1 of 2)]
[im 5/34  bone]
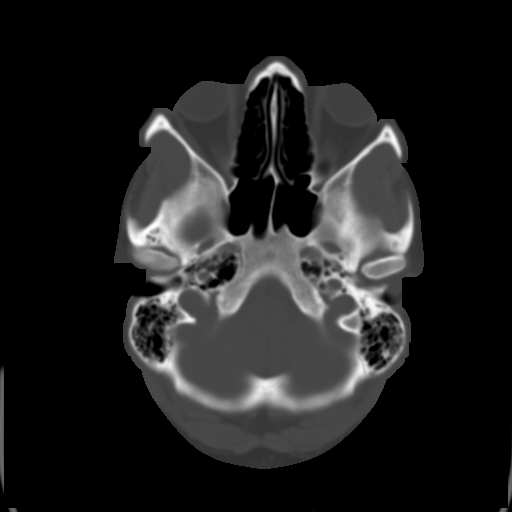
[im 9/34  bone]
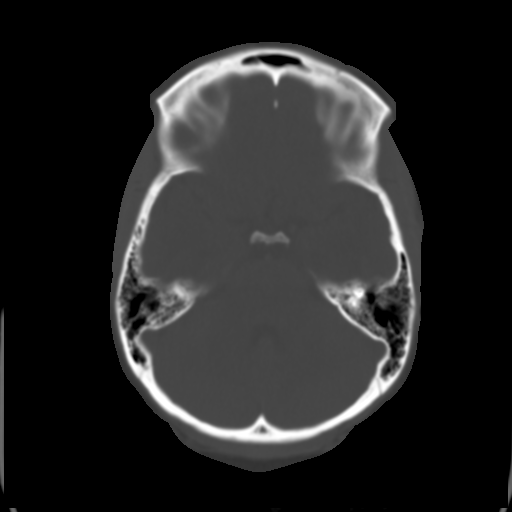
[im 13/34  bone]
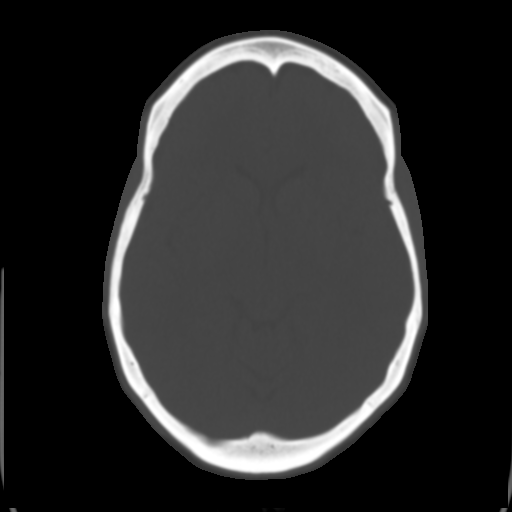
[im 17/34  bone]
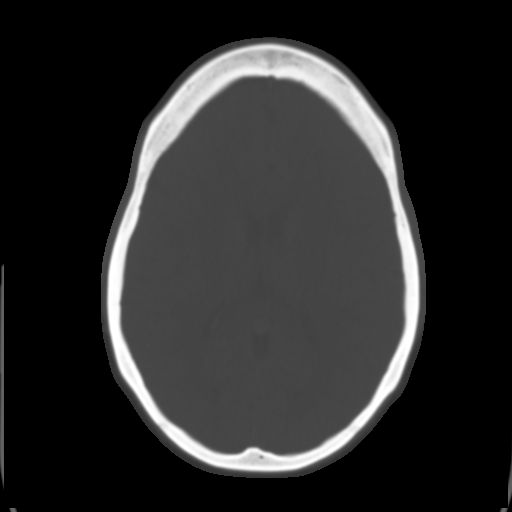
[im 21/34  bone]
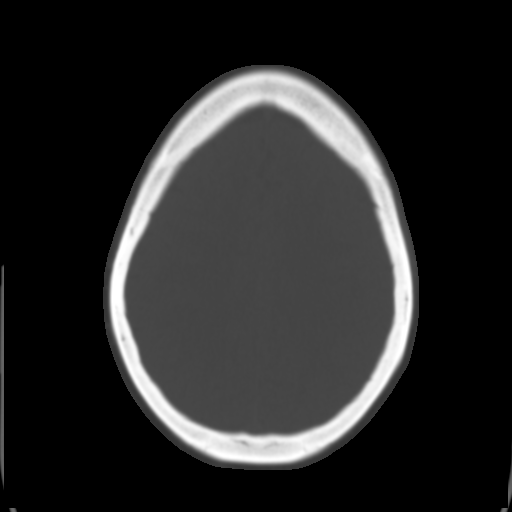

[Series 5: head 3.0 mpr cor · coronal · 0.32mm/px · 2 of 69 slices shown]
[im 23/69  bone]
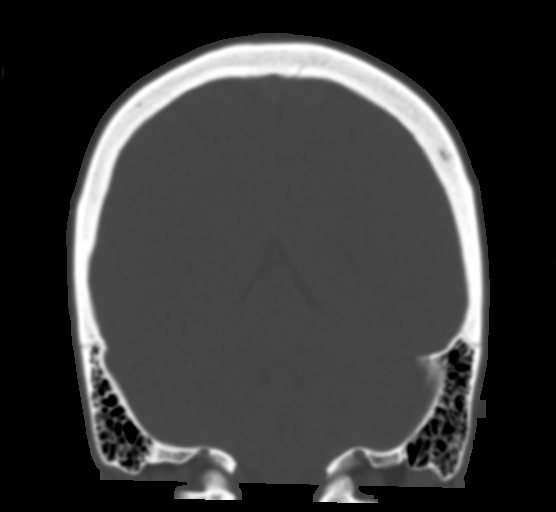
[im 46/69  bone]
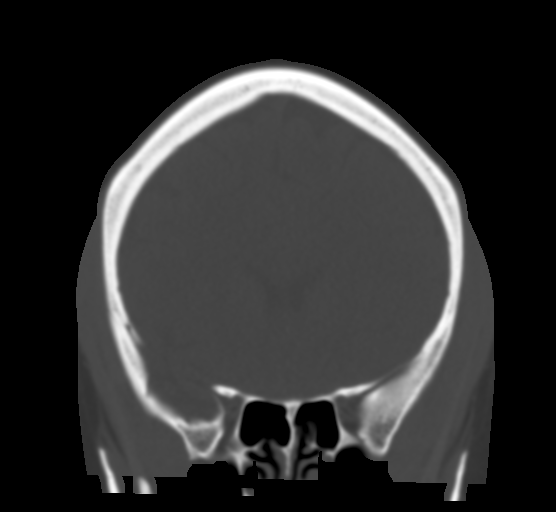

[Series 7: head 5.0 h30s · axial · 0.44mm/px · z∈[-78,+52]mm · 8 of 34 slices shown, 10 images (2 of 2)]
[im 4/34  brain]
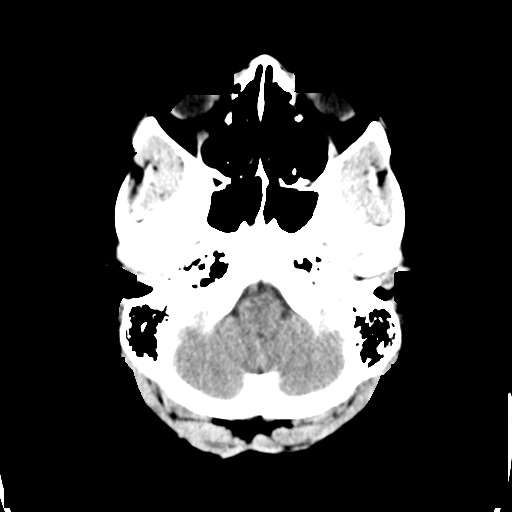
[im 4/34  bone]
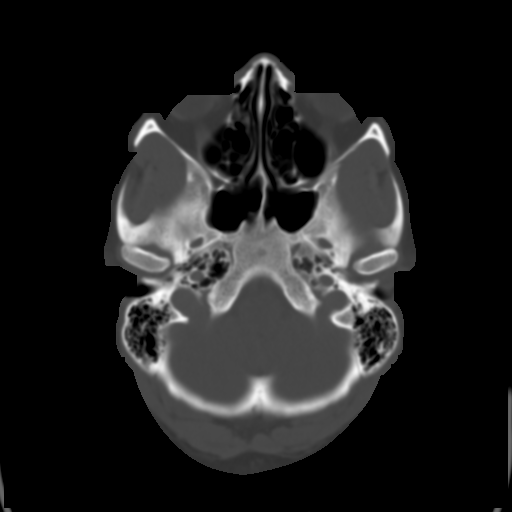
[im 8/34  bone]
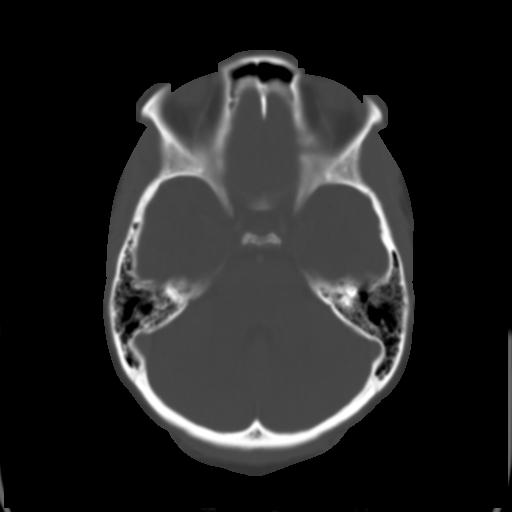
[im 12/34  bone]
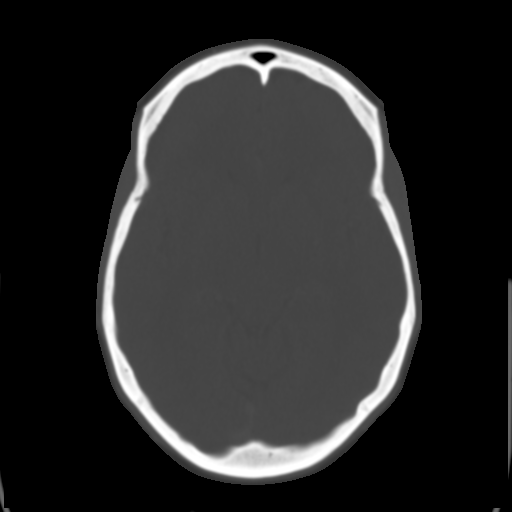
[im 15/34  bone]
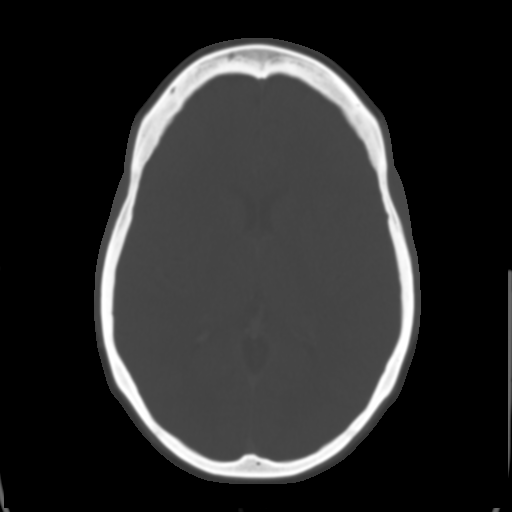
[im 19/34  brain]
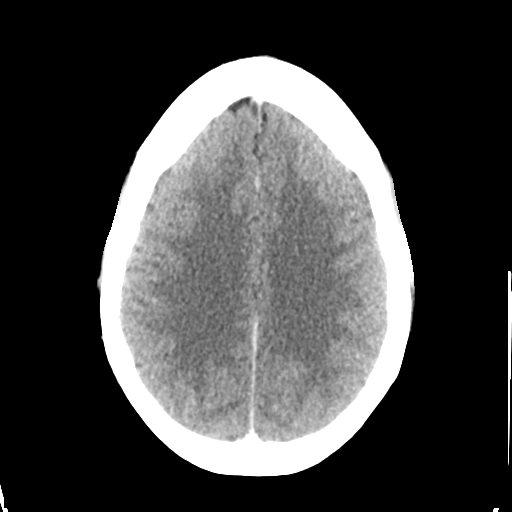
[im 19/34  bone]
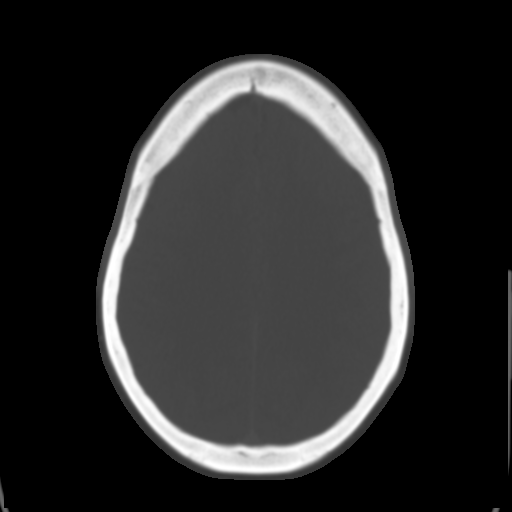
[im 23/34  bone]
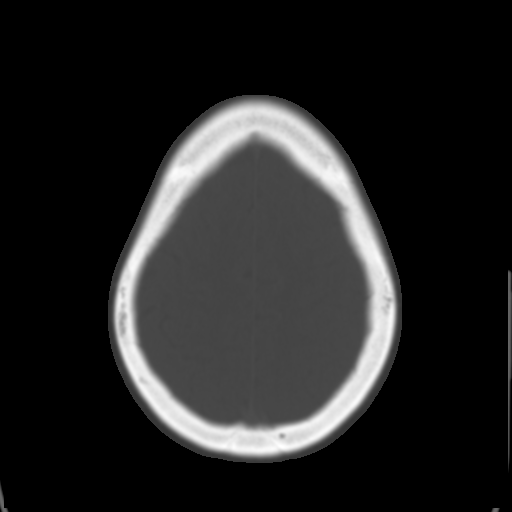
[im 26/34  bone]
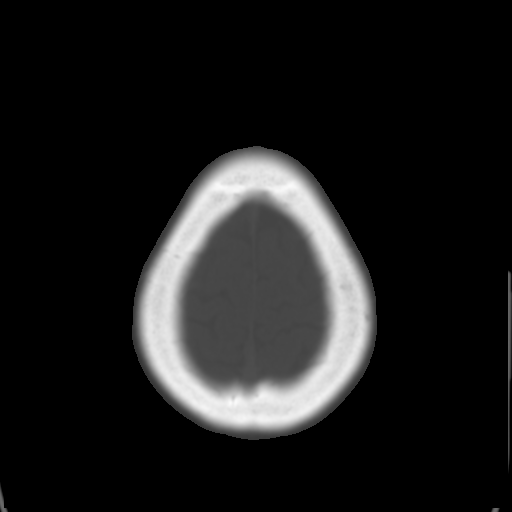
[im 30/34  bone]
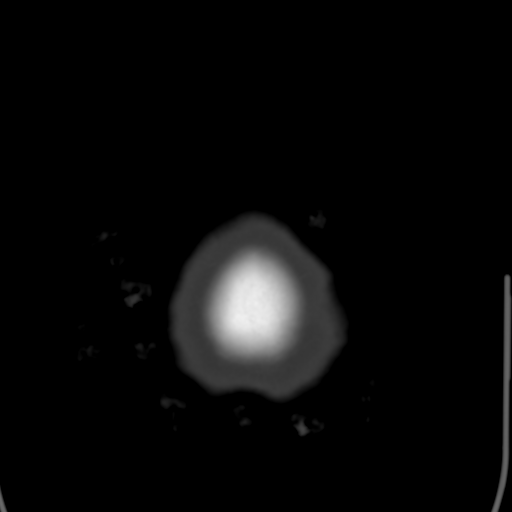

[Series 16: sagittal soft tissue · sagittal · 0.14mm/px · 1 of 76 slices shown]
[im 38/76  bone]
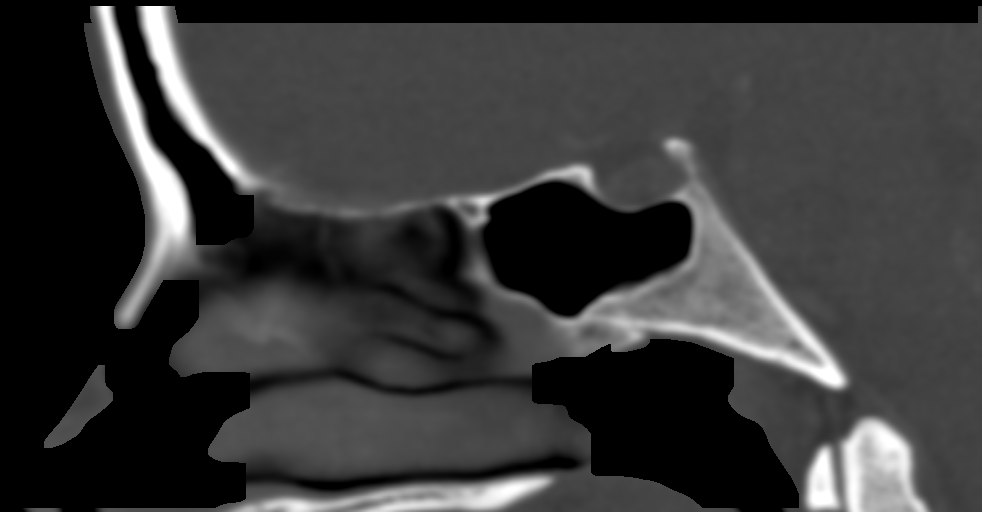

[16 of 47 positions shown; findings below may reference images not displayed]

FINDINGS: CT HEAD FINDINGS

Brain: No mass lesion, intraparenchymal hemorrhage or extra-axial
collection. No evidence of acute cortical infarct. Brain parenchyma
and CSF-containing spaces are normal for age.

Vascular: No hyperdense vessel or unexpected calcification.

Skull: Normal visualized skull base, calvarium and extracranial soft
tissues.

CT ORBITS FINDINGS

Orbits:

--Globes: Normal.

--Bony orbit: Normal.

--Preseptal soft tissues: Normal.

--Intra- and extraconal orbital fat: Normal. No inflammatory
stranding.

--Optic nerves: Normal.

--Lacrimal glands and fossae: Normal.

--Extraocular muscles: Normal.

Visualized sinuses:  No fluid levels or advanced mucosal thickening.

Soft tissues: Normal.
IMPRESSION: Normal CT of the head and orbits.

## 2022-04-24 ENCOUNTER — Encounter: Payer: Self-pay | Admitting: Infectious Diseases
